# Patient Record
Sex: Female | Born: 1980 | Race: White | Hispanic: No | Smoking: Current every day smoker
Health system: Southern US, Community
[De-identification: ages and names within clinical notes are randomized; demographics above are authoritative.]

## PROBLEM LIST (undated history)

## (undated) DIAGNOSIS — K219 Gastro-esophageal reflux disease without esophagitis: Secondary | ICD-10-CM

## (undated) DIAGNOSIS — M797 Fibromyalgia: Secondary | ICD-10-CM

## (undated) DIAGNOSIS — K759 Inflammatory liver disease, unspecified: Secondary | ICD-10-CM

## (undated) DIAGNOSIS — B1920 Unspecified viral hepatitis C without hepatic coma: Secondary | ICD-10-CM

## (undated) DIAGNOSIS — F32A Depression, unspecified: Secondary | ICD-10-CM

## (undated) DIAGNOSIS — F209 Schizophrenia, unspecified: Secondary | ICD-10-CM

## (undated) DIAGNOSIS — I10 Essential (primary) hypertension: Secondary | ICD-10-CM

## (undated) HISTORY — PX: CHOLECYSTECTOMY: SHX55

## (undated) HISTORY — PX: TUBAL LIGATION: SHX77

## (undated) HISTORY — PX: BREAST SURGERY: SHX581

## (undated) HISTORY — PX: TONSILLECTOMY: SUR1361

## (undated) HISTORY — PX: EUSTACHIAN TUBE DILATION: SHX6770

---
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
## 2003-07-19 ENCOUNTER — Inpatient Hospital Stay (HOSPITAL_COMMUNITY): Admission: AD | Admit: 2003-07-19 | Discharge: 2003-07-23 | Payer: Self-pay | Admitting: Psychiatry

## 2008-12-15 DIAGNOSIS — IMO0001 Reserved for inherently not codable concepts without codable children: Secondary | ICD-10-CM | POA: Insufficient documentation

## 2008-12-15 DIAGNOSIS — F329 Major depressive disorder, single episode, unspecified: Secondary | ICD-10-CM | POA: Insufficient documentation

## 2008-12-15 DIAGNOSIS — IMO0002 Reserved for concepts with insufficient information to code with codable children: Secondary | ICD-10-CM | POA: Insufficient documentation

## 2008-12-15 DIAGNOSIS — T85898A Other specified complication of other internal prosthetic devices, implants and grafts, initial encounter: Secondary | ICD-10-CM | POA: Insufficient documentation

## 2008-12-15 DIAGNOSIS — F32A Depression, unspecified: Secondary | ICD-10-CM | POA: Insufficient documentation

## 2008-12-15 DIAGNOSIS — R21 Rash and other nonspecific skin eruption: Secondary | ICD-10-CM | POA: Insufficient documentation

## 2008-12-29 ENCOUNTER — Encounter: Payer: Self-pay | Admitting: Internal Medicine

## 2009-01-06 ENCOUNTER — Ambulatory Visit: Payer: Self-pay | Admitting: Infectious Diseases

## 2009-01-06 LAB — CONVERTED CEMR LAB
ALT: 15 units/L (ref 0–35)
AST: 17 units/L (ref 0–37)
Albumin: 4.1 g/dL (ref 3.5–5.2)
Alkaline Phosphatase: 83 units/L (ref 39–117)
BUN: 12 mg/dL (ref 6–23)
Basophils Absolute: 0 10*3/uL (ref 0.0–0.1)
Basophils Relative: 0 % (ref 0–1)
CO2: 32 meq/L (ref 19–32)
Calcium: 9.6 mg/dL (ref 8.4–10.5)
Chloride: 98 meq/L (ref 96–112)
Creatinine, Ser: 0.78 mg/dL (ref 0.40–1.20)
Eosinophils Absolute: 0.2 10*3/uL (ref 0.0–0.7)
Eosinophils Relative: 1 % (ref 0–5)
GFR calc Af Amer: >60 mL/min (ref >60)
GFR calc non Af Amer: >60 mL/min (ref >60)
Glucose, Bld: 85 mg/dL (ref 70–99)
HCT: 40.7 % (ref 36.0–46.0)
Hemoglobin: 13.8 g/dL (ref 12.0–15.0)
Lymphocytes Relative: 19 % (ref 12–46)
Lymphs Abs: 2.9 10*3/uL (ref 0.7–4.0)
MCHC: 33.9 g/dL (ref 30.0–36.0)
MCV: 92.1 fL (ref 78.0–100.0)
Monocytes Absolute: 0.9 10*3/uL (ref 0.1–1.0)
Monocytes Relative: 6 % (ref 3–12)
Neutro Abs: 11.1 10*3/uL — ABNORMAL HIGH (ref 1.7–7.7)
Neutrophils Relative %: 74 % (ref 43–77)
Platelets: 262 10*3/uL (ref 150–400)
Potassium: 4.3 meq/L (ref 3.5–5.3)
RBC: 4.42 M/uL (ref 3.87–5.11)
RDW: 13 % (ref 11.5–15.5)
Sed Rate: 13 mm/hr (ref 0–22)
Sodium: 140 meq/L (ref 135–145)
Total Bilirubin: 0.3 mg/dL (ref 0.3–1.2)
Total Protein: 7 g/dL (ref 6.0–8.3)
WBC: 15.1 10*3/uL — ABNORMAL HIGH (ref 4.0–10.5)

## 2009-01-07 ENCOUNTER — Encounter: Payer: Self-pay | Admitting: Infectious Diseases

## 2009-01-08 ENCOUNTER — Encounter: Payer: Self-pay | Admitting: Infectious Diseases

## 2009-02-03 ENCOUNTER — Ambulatory Visit: Payer: Self-pay | Admitting: Infectious Diseases

## 2009-05-11 ENCOUNTER — Encounter: Payer: Self-pay | Admitting: Infectious Diseases

## 2009-11-23 ENCOUNTER — Encounter: Payer: Self-pay | Admitting: Infectious Diseases

## 2010-10-26 NOTE — Consult Note (Signed)
Summary: [facility]  [facility]   Imported By: Florinda Marker 03/11/2009 15:31:26  _____________________________________________________________________  External Attachment:    Type:   Image     Comment:   External Document

## 2010-10-26 NOTE — Assessment & Plan Note (Signed)
Summary: 1 MONTH F/U MRSA/CFB   Visit Type:  Follow-up Primary Provider:  Dr Ardelle Park in [place]  CC:  follow-up visit; place at back of head has drainage.  History of Present Illness: 30 yo with history of bipolar who I saw for recurrent abscesses a month ago.  Treated her with doxy and bactroban.  Cx of wound did grow MRSA.  Presents now for follow up and is tearful and says she is very stressed. She finished the doxycyclein and bactroban nasal ointment. ALso using the soap rec by the dermatologist. Per pt the doxy had no effect on the progression or occurrence of th s kin lesions.   Has not seen derm in follow up.  Is having a lot of issues with nerve pills. Lesions on breast are healing and then recurring.   Stays hot all the time.   Under alot of stress with husband and kids.    Preventive Screening-Counseling & Management     Alcohol drinks/day: 0     Smoking Status: current     Packs/Day: 0.5     Caffeine use/day: 3 20oz. bottles per day     Does Patient Exercise: no     Seat Belt Use: yes   Prior Medication List:  NAPROXEN DR 500 MG TBEC (NAPROXEN) Take 1 tablet by mouth two times a day for 14 days VISTARIL 25 MG CAPS (HYDROXYZINE PAMOATE) Take 1 tablet by mouth every 6 hours as needed itching DOXYCYCLINE HYCLATE 100 MG CPEP (DOXYCYCLINE HYCLATE) one by mouth two times a day BACTROBAN NASAL 2 % OINT (MUPIROCIN CALCIUM) apply to nares twice a day for 2 weeks   Current Allergies (reviewed today): No known allergies  Past History:  Past Medical History:    Fibromyalgia    Bipolar    Oral HSV    Breast implants     (01/06/2009)  Past Surgical History:    BTL    Gallbladder resection    Breast implants - leaking implant (01/06/2009)  Family History:    Father has skin boils  (01/06/2009)  Social History:    Has 2 dogs at home.  No other skin lesions at home.      Has a 84 yo daughter and a 53 yo.      No use of hot tubs or whirlpool foot baths  (01/06/2009)  Risk Factors:    Alcohol Use: 0 (02/03/2009)    >5 drinks/d w/in last 3 months: N/A    Caffeine Use: 3 20oz. bottles per day (02/03/2009)    Diet: N/A    Exercise: no (02/03/2009)  Risk Factors:    Smoking Status: current (02/03/2009)    Packs/Day: 0.5 (02/03/2009)    Cigars/wk: N/A    Pipe Use/wk: N/A    Cans of tobacco/wk: N/A    Passive Smoke Exposure: N/A  Review of Systems       11 systems reviewed and negative except per HPI   Vital Signs:  Patient profile:   30 year old female Height:      64 inches (162.56 cm) Weight:      189.7 pounds (86.23 kg) BMI:     32.68 Temp:     97.6 degrees F (36.44 degrees C) oral Pulse rate:   100 / minute BP sitting:   150 / 98  (right arm)  Vitals Entered By: Baxter Hire) (Feb 03, 2009 2:09 PM) CC: follow-up visit; place at back of head has drainage Is Patient Diabetic? No Pain Assessment Patient  in pain? yes     Location: body aches Intensity: 7 Type: aching Onset of pain  Constant pain  Nutritional Status BMI of > 30 = obese Nutritional Status Detail no appetite per patient  Have you ever been in a relationship where you felt threatened, hurt or afraid?Unable to ask   Does patient need assistance? Functional Status Self care Ambulation Normal   Physical Exam  General:  alert.   Head:  scalp with several areas of denuded skin but no active drainage Eyes:  vision grossly intact and pupils equal.   Mouth:  fair dentition.   Neck:  supple.   Breasts:  bil scaring from prior lesions Lungs:  normal respiratory effort and no intercostal retractions.   Heart:  normal rate and regular rhythm.   Abdomen:  soft and non-tender.   Msk:  normal ROM and no joint tenderness.   Extremities:  no cce Neurologic:  alert & oriented X3.   Skin:  L ant shin with 2 areas of denuded ulcers but no drainage arms with a few areas of old healed scabs Cervical Nodes:  no anterior cervical adenopathy and no posterior  cervical adenopathy.   Psych:  quite anxious and depressed appeariong, tearful   Impression & Recommendations:  Problem # 1:  CELLULITIS AND ABSCESS OF UPPER ARM AND FOREARM (ICD-682.3) She is colonized with MRSA which would explain the recurrences of the skin lesions when she gets breaks in the skin.  I think she may also be scratching and picking at the lesions especially given her anxiety and depression.   Will treat her with doxy for a prolonged period at this point - rx givne.  Also will try clindamycin lotion for armpits and groin to decrease colonization.   Suppressive MRSA treatment -  Recommended to use bactroban nasal ointment to anterior  nares two times a day and hibiclens 4% shower lotion daily for next month.   Advised re  sharing towels, hygiene and need to present for I and D and antibiotics if lesions recur. I have called her PCP office to get records faxed re prior biopsy results and micro results and will await those as well. She may very well need repeat derm eval  here in [place] to repeat bxp and send for routine cx, afb and fungal cx and path.  I will see back in 2 months or sooner if needed.   The following medications were removed from the medication list:    Doxycycline Hyclate 100 Mg Cpep (Doxycycline hyclate) ..... One by mouth two times a day Her updated medication list for this problem includes:    Doxycycline Hyclate 100 Mg Caps (Doxycycline hyclate) ..... One by mouth two times a day  Problem # 2:  DEPRESSION (ICD-311) She needs to follow up with her PCP for her stress and anxiety. Her updated medication list for this problem includes:    Vistaril 25 Mg Caps (Hydroxyzine pamoate) .Marland Kitchen... Take 1 tablet by mouth every 6 hours as needed itching  Medications Added to Medication List This Visit: 1)  Doxycycline Hyclate 100 Mg Caps (Doxycycline hyclate) .... One by mouth two times a day 2)  Cleocin-t 1 % Lotn (Clindamycin phosphate) .... Apply to armpits and groin  twice a day 3)  Bactroban Nasal 2 % Oint (Mupirocin calcium) .... Apply to nose twice a day indefinitely 4)  Hibiclens 4 % Liqd (Chlorhexidine gluconate) .... Shower daily with the soap as directed 5)  Fluconazole 150 Mg Tabs (Fluconazole) .Marland KitchenMarland KitchenMarland Kitchen  Take as needed for yeast infection  Patient Instructions: 1)  Please schedule a follow-up appointment in 2 months. Prescriptions: FLUCONAZOLE 150 MG TABS (FLUCONAZOLE) take as needed for yeast infection  #4 x 1   Entered and Authorized by:   Clydie Braun MD   Signed by:   Clydie Braun MD on 02/03/2009   Method used:   Print then Give to Patient   RxID:   [id number] HIBICLENS 4 % LIQD (CHLORHEXIDINE GLUCONATE) shower daily with the soap as directed  #1 x 1   Entered and Authorized by:   Clydie Braun MD   Signed by:   Clydie Braun MD on 02/03/2009   Method used:   Print then Give to Patient   RxID:   [id number] NATFTDDUK NASAL 2 % OINT (MUPIROCIN CALCIUM) apply to nose twice a day indefinitely  #1 x 3   Entered and Authorized by:   Clydie Braun MD   Signed by:   Clydie Braun MD on 02/03/2009   Method used:   Print then Give to Patient   RxID:   [id number] CLEOCIN-T 1 % LOTN (CLINDAMYCIN PHOSPHATE) apply to armpits and groin twice a day  #1 x 1   Entered and Authorized by:   Clydie Braun MD   Signed by:   Clydie Braun MD on 02/03/2009   Method used:   Print then Give to Patient   RxID:   [id number] DOXYCYCLINE HYCLATE 100 MG CAPS (DOXYCYCLINE HYCLATE) one by mouth two times a day  #60 x 1   Entered and Authorized by:   Clydie Braun MD   Signed by:   Clydie Braun MD on 02/03/2009   Method used:   Print then Give to Patient   RxID:   [id number]

## 2010-10-26 NOTE — Miscellaneous (Signed)
Summary: [facility]  [facility]   Imported By: Florinda Marker 05/13/2009 15:04:58  _____________________________________________________________________  External Attachment:    Type:   Image     Comment:   External Document

## 2010-10-26 NOTE — Miscellaneous (Signed)
Summary: HIPAA Restrictions  HIPAA Restrictions   Imported By: Florinda Marker 01/06/2009 15:04:02  _____________________________________________________________________  External Attachment:    Type:   Image     Comment:   External Document

## 2010-10-26 NOTE — Assessment & Plan Note (Signed)
Summary: new pt body lesion resfrom 4/5/kam   Visit Type:  Consult Primary Provider:  Dr Ardelle Park in [place]  CC:  New Patient;body lesion .  History of Present Illness: 30 yo with bipolar disorder,  Her skin lesions began when she had breast implants Dec 07.  She developed 1 yr ago some  lesions on the right.  Begin as a pimple and then scar over.  In the beginning they drained pus and were very itchy but now not draining. Some spreading redness comes and goes. Has had extensive work up for these with visits to surgeons, dermatologists and gotten a number of courses of antibiotics.  Also getting steroid injsctions into nodule in breast. No other sick contacts or skin lesions in family members except daughter with molluscum.  No diffuse pruritis but does have some ithcing over breasts.  Also developing new lesions under arms and on scalp (most recent in March).  Currently also has one on L leg.   Does have fevers chills with these at times.  Also gets joint pains that make it difficult to o walk.    Bxp done at [facility] - Dr Lars Pinks.      Preventive Screening-Counseling & Management     Alcohol drinks/day: occassionally     Alcohol type: beer     Smoking Status: current     Packs/Day: 0.5     Caffeine use/day: yes, sodas all day     Does Patient Exercise: no     Seat Belt Use: yes   Updated Prior Medication List: NAPROXEN DR 500 MG TBEC (NAPROXEN) Take 1 tablet by mouth two times a day for 14 days VISTARIL 25 MG CAPS (HYDROXYZINE PAMOATE) Take 1 tablet by mouth every 6 hours as needed itching  Current Allergies (reviewed today): No known allergies  Past History:  Family History:    Father has skin boils  (01/06/2009)  Social History:    Has 2 dogs at home.  No other skin lesions at home.      Has a 13 yo daughter and a 58 yo.      No use of hot tubs or whirlpool foot baths (01/06/2009)  Risk Factors:    Alcohol Use: occassionally (01/06/2009)    >5 drinks/d w/in last 3  months: N/A    Caffeine Use: yes, sodas all day (01/06/2009)    Diet: N/A    Exercise: no (01/06/2009)  Risk Factors:    Smoking Status: current (01/06/2009)    Packs/Day: 0.5 (01/06/2009)    Cigars/wk: N/A    Pipe Use/wk: N/A    Cans of tobacco/wk: N/A    Passive Smoke Exposure: N/A  Past Medical History:    Fibromyalgia    Bipolar    Oral HSV    Breast implants  Past Surgical History:    BTL    Gallbladder resection    Breast implants - leaking implant  Family History:    Father has skin boils   Social History:    Has 2 dogs at home.  No other skin lesions at home.      Has a 15 yo daughter and a 48 yo.      No use of hot tubs or whirlpool foot baths  Review of Systems       11 systems reviewed and negative except per HPI   Vital Signs:  Patient profile:   30 year old female Height:      64 inches (162.56 cm) Weight:  192.8 pounds (87.64 kg) BMI:     33.21 Temp:     97.5 degrees F (36.39 degrees C) oral Pulse rate:   116 / minute BP sitting:   180 / 111  (left arm)  Vitals Entered By: Baxter Hire) (January 06, 2009 11:53 AM) CC: New Patient;body lesion  Is Patient Diabetic? No Pain Assessment Patient in pain? yes     Location: legs and joints Intensity: 4 Type: cramping in legs;aching in joints Nutritional Status BMI of > 30 = obese Nutritional Status Detail not good   Have you ever been in a relationship where you felt threatened, hurt or afraid?Unable to ask   Does patient need assistance? Functional Status Self care Ambulation Normal   Physical Exam  General:  alert and well-developed.  overwt Eyes:  vision grossly intact and pupils equal.   Mouth:  good dentition.   Neck:  supple.   Lungs:  normal respiratory effort and normal breath sounds.   Heart:  normal rate, regular rhythm, and no murmur.   Abdomen:  soft and non-tender.   Msk:  normal ROM, no joint tenderness, and no joint swelling.   Extremities:  no cce Neurologic:   alert & oriented X3, cranial nerves II-XII intact, and strength normal in all extremities.   Skin:  L breast with 2 1/2 cm  nodules that are firm, rubbery and non tender also has a keloid apperaing scar on teh breast  L ant shin with 2 cm lesions that are shallow and red based in nature. area of healed alopecia about 3 cm in diam on post scalp but no active lesion there Cervical Nodes:  no anterior cervical adenopathy and no posterior cervical adenopathy.   Axillary Nodes:  bil axila with small pustules appearing like hidradenitis Psych:  Oriented X3 and memory intact for recent and remote.     Impression & Recommendations:  Problem # 1:  RASH AND OTHER NONSPECIFIC SKIN ERUPTION (ICD-782.1) Several months of skin lesions in a relatively healthy young woman,   I have a paucity of data and records and it appears she has had an extensive work up for this including biopsies and cultures My diff dx would include  recurrent MRSA, pyoderma, atypical mycobacterial infeciton, connective tissue disease, vascilitis, atypical supprative hidradenitis, keloid. Today will check  basic BW including ESR and I have swabbed her leg lesion for routine cx. If MRSA is present  I think she will likely need prolonged suppressive abx, chlorohexidine and bactroban to ensure that she is not jsut having recurrent MRSA boils with scarring.    After obtaining records will need to consider repeat bxp for path, fungal and afb cx.   Orders: Consultation Level IV (09811) T-Comprehensive Metabolic Panel [phone]) T-CBC w/Diff [id number]) T- Sed rate non-auto (96295) T-Syphilis Test ([facility]) [id number]) T-Culture, Wound ([id number])  Medications Added to Medication List This Visit: 1)  Doxycycline Hyclate 100 Mg Cpep (Doxycycline hyclate) .... One by mouth two times a day 2)  Bactroban Nasal 2 % Oint (Mupirocin calcium) .... Apply to nares twice a day for 2 weeks  Patient Instructions: 1)  Please schedule  a follow-up appointment in 3 weeks.   Prescriptions: BACTROBAN NASAL 2 % OINT (MUPIROCIN CALCIUM) apply to nares twice a day for 2 weeks  #1 x 1   Entered and Authorized by:   Clydie Braun MD   Signed by:   Clydie Braun MD on 01/09/2009   Method used:   Electronically to  [facility]  [address] [phone]* (retail)       [address]       [place], [place]  [address]       Ph: [phone] or [phone]       Fax: [phone]   RxID:   [id number] DOXYCYCLINE HYCLATE 100 MG CPEP (DOXYCYCLINE HYCLATE) one by mouth two times a day  #20 x 0   Entered and Authorized by:   Clydie Braun MD   Signed by:   Clydie Braun MD on 01/09/2009   Method used:   [facility]ically to        [facility]  [address] [phone]* (retail)       [address]       [place], [place]  [address]       Ph: [phone] or [phone]       Fax: [phone]   RxID:   [id number]

## 2010-10-26 NOTE — Consult Note (Signed)
Summary: New Pt. Referral: [facility]  New Pt. Referral: [facility]   Imported By: Florinda Marker 02/02/2009 12:19:22  _____________________________________________________________________  External Attachment:    Type:   Image     Comment:   External Document

## 2010-10-29 NOTE — Miscellaneous (Signed)
Summary: [facility]  [facility]   Imported By: Florinda Marker 11/26/2009 10:24:12  _____________________________________________________________________  External Attachment:    Type:   Image     Comment:   External Document

## 2017-11-10 DIAGNOSIS — F1210 Cannabis abuse, uncomplicated: Secondary | ICD-10-CM | POA: Insufficient documentation

## 2017-11-10 DIAGNOSIS — J101 Influenza due to other identified influenza virus with other respiratory manifestations: Secondary | ICD-10-CM | POA: Insufficient documentation

## 2020-12-31 ENCOUNTER — Inpatient Hospital Stay (HOSPITAL_COMMUNITY): Payer: Medicare Other | Admitting: Certified Registered"

## 2020-12-31 ENCOUNTER — Encounter (HOSPITAL_COMMUNITY)
Admission: RE | Disposition: A | Discharge status: Home / Self Care | Payer: Self-pay | Source: Home / Self Care | Attending: Orthopedic Surgery

## 2020-12-31 ENCOUNTER — Inpatient Hospital Stay (HOSPITAL_COMMUNITY): Payer: Medicare Other

## 2020-12-31 ENCOUNTER — Other Ambulatory Visit: Payer: Self-pay

## 2020-12-31 ENCOUNTER — Inpatient Hospital Stay (HOSPITAL_COMMUNITY)
Admission: RE | Admit: 2020-12-31 | Discharge: 2021-01-02 | DRG: 481 | Disposition: A | Discharge status: Home / Self Care | Payer: Medicare Other | Attending: Orthopedic Surgery | Admitting: Orthopedic Surgery

## 2020-12-31 ENCOUNTER — Encounter (HOSPITAL_COMMUNITY): Payer: Self-pay | Admitting: Orthopedic Surgery

## 2020-12-31 DIAGNOSIS — F17210 Nicotine dependence, cigarettes, uncomplicated: Secondary | ICD-10-CM | POA: Diagnosis not present

## 2020-12-31 DIAGNOSIS — E8889 Other specified metabolic disorders: Secondary | ICD-10-CM | POA: Diagnosis not present

## 2020-12-31 DIAGNOSIS — F1911 Other psychoactive substance abuse, in remission: Secondary | ICD-10-CM

## 2020-12-31 DIAGNOSIS — E559 Vitamin D deficiency, unspecified: Secondary | ICD-10-CM | POA: Diagnosis present

## 2020-12-31 DIAGNOSIS — K219 Gastro-esophageal reflux disease without esophagitis: Secondary | ICD-10-CM | POA: Diagnosis not present

## 2020-12-31 DIAGNOSIS — I10 Essential (primary) hypertension: Secondary | ICD-10-CM | POA: Diagnosis not present

## 2020-12-31 DIAGNOSIS — Z79899 Other long term (current) drug therapy: Secondary | ICD-10-CM | POA: Diagnosis not present

## 2020-12-31 DIAGNOSIS — Z419 Encounter for procedure for purposes other than remedying health state, unspecified: Secondary | ICD-10-CM

## 2020-12-31 DIAGNOSIS — S93401A Sprain of unspecified ligament of right ankle, initial encounter: Secondary | ICD-10-CM | POA: Diagnosis not present

## 2020-12-31 DIAGNOSIS — S93432A Sprain of tibiofibular ligament of left ankle, initial encounter: Secondary | ICD-10-CM | POA: Diagnosis present

## 2020-12-31 DIAGNOSIS — Z9049 Acquired absence of other specified parts of digestive tract: Secondary | ICD-10-CM

## 2020-12-31 DIAGNOSIS — S82402A Unspecified fracture of shaft of left fibula, initial encounter for closed fracture: Secondary | ICD-10-CM | POA: Diagnosis present

## 2020-12-31 DIAGNOSIS — Z885 Allergy status to narcotic agent status: Secondary | ICD-10-CM

## 2020-12-31 DIAGNOSIS — D62 Acute posthemorrhagic anemia: Secondary | ICD-10-CM | POA: Diagnosis not present

## 2020-12-31 DIAGNOSIS — Z880 Allergy status to penicillin: Secondary | ICD-10-CM | POA: Diagnosis not present

## 2020-12-31 DIAGNOSIS — M797 Fibromyalgia: Secondary | ICD-10-CM | POA: Diagnosis present

## 2020-12-31 DIAGNOSIS — Z20822 Contact with and (suspected) exposure to covid-19: Secondary | ICD-10-CM | POA: Diagnosis not present

## 2020-12-31 DIAGNOSIS — F209 Schizophrenia, unspecified: Secondary | ICD-10-CM | POA: Diagnosis present

## 2020-12-31 DIAGNOSIS — S82872A Displaced pilon fracture of left tibia, initial encounter for closed fracture: Principal | ICD-10-CM | POA: Diagnosis present

## 2020-12-31 DIAGNOSIS — F17200 Nicotine dependence, unspecified, uncomplicated: Secondary | ICD-10-CM | POA: Diagnosis present

## 2020-12-31 DIAGNOSIS — Z888 Allergy status to other drugs, medicaments and biological substances status: Secondary | ICD-10-CM

## 2020-12-31 DIAGNOSIS — B1920 Unspecified viral hepatitis C without hepatic coma: Secondary | ICD-10-CM | POA: Diagnosis present

## 2020-12-31 DIAGNOSIS — T148XXA Other injury of unspecified body region, initial encounter: Secondary | ICD-10-CM

## 2020-12-31 DIAGNOSIS — W19XXXA Unspecified fall, initial encounter: Secondary | ICD-10-CM | POA: Diagnosis not present

## 2020-12-31 DIAGNOSIS — S8290XA Unspecified fracture of unspecified lower leg, initial encounter for closed fracture: Secondary | ICD-10-CM | POA: Insufficient documentation

## 2020-12-31 HISTORY — DX: Unspecified viral hepatitis C without hepatic coma: B19.20

## 2020-12-31 HISTORY — DX: Fibromyalgia: M79.7

## 2020-12-31 HISTORY — DX: Essential (primary) hypertension: I10

## 2020-12-31 HISTORY — PX: EXTERNAL FIXATION LEG: SHX1549

## 2020-12-31 HISTORY — DX: Gastro-esophageal reflux disease without esophagitis: K21.9

## 2020-12-31 HISTORY — DX: Schizophrenia, unspecified: F20.9

## 2020-12-31 LAB — COMPREHENSIVE METABOLIC PANEL
ALT: 24 U/L (ref 0–44)
AST: 27 U/L (ref 15–41)
Albumin: 4 g/dL (ref 3.5–5.0)
Alkaline Phosphatase: 60 U/L (ref 38–126)
Anion gap: 6 (ref 5–15)
BUN: 11 mg/dL (ref 6–20)
CO2: 27 mmol/L (ref 22–32)
Calcium: 9.2 mg/dL (ref 8.9–10.3)
Chloride: 103 mmol/L (ref 98–111)
Creatinine, Ser: 0.73 mg/dL (ref 0.44–1.00)
GFR, Estimated: >60 mL/min (ref >60)
Glucose, Bld: 105 mg/dL — ABNORMAL HIGH (ref 70–99)
Potassium: 3.9 mmol/L (ref 3.5–5.1)
Sodium: 136 mmol/L (ref 135–145)
Total Bilirubin: 1.1 mg/dL (ref 0.3–1.2)
Total Protein: 6.8 g/dL (ref 6.5–8.1)

## 2020-12-31 LAB — RAPID URINE DRUG SCREEN, HOSP PERFORMED
Amphetamines: POSITIVE — AB
Barbiturates: NOT DETECTED
Benzodiazepines: POSITIVE — AB
Cocaine: NOT DETECTED
Opiates: POSITIVE — AB
Tetrahydrocannabinol: POSITIVE — AB

## 2020-12-31 LAB — CBC WITH DIFFERENTIAL/PLATELET
Abs Immature Granulocytes: 0.05 10*3/uL (ref 0.00–0.07)
Basophils Absolute: 0.1 10*3/uL (ref 0.0–0.1)
Basophils Relative: 1 %
Eosinophils Absolute: 0.1 10*3/uL (ref 0.0–0.5)
Eosinophils Relative: 2 %
HCT: 39.5 % (ref 36.0–46.0)
Hemoglobin: 13.3 g/dL (ref 12.0–15.0)
Immature Granulocytes: 1 %
Lymphocytes Relative: 21 %
Lymphs Abs: 1.8 10*3/uL (ref 0.7–4.0)
MCH: 31.6 pg (ref 26.0–34.0)
MCHC: 33.7 g/dL (ref 30.0–36.0)
MCV: 93.8 fL (ref 80.0–100.0)
Monocytes Absolute: 0.8 10*3/uL (ref 0.1–1.0)
Monocytes Relative: 9 %
Neutro Abs: 5.9 10*3/uL (ref 1.7–7.7)
Neutrophils Relative %: 66 %
Platelets: 229 10*3/uL (ref 150–400)
RBC: 4.21 MIL/uL (ref 3.87–5.11)
RDW: 12.7 % (ref 11.5–15.5)
WBC: 8.7 10*3/uL (ref 4.0–10.5)
nRBC: 0 % (ref 0.0–0.2)

## 2020-12-31 LAB — PROTIME-INR
INR: 1 (ref 0.8–1.2)
Prothrombin Time: 13 seconds (ref 11.4–15.2)

## 2020-12-31 LAB — URINALYSIS, ROUTINE W REFLEX MICROSCOPIC
Bilirubin Urine: NEGATIVE
Glucose, UA: NEGATIVE mg/dL
Ketones, ur: 5 mg/dL — AB
Nitrite: NEGATIVE
Protein, ur: 100 mg/dL — AB
Specific Gravity, Urine: 1.028 (ref 1.005–1.030)
WBC, UA: >50 WBC/hpf — ABNORMAL HIGH (ref 0–5)
pH: 5 (ref 5.0–8.0)

## 2020-12-31 LAB — SURGICAL PCR SCREEN
MRSA, PCR: NEGATIVE
Staphylococcus aureus: NEGATIVE

## 2020-12-31 LAB — POCT PREGNANCY, URINE: Preg Test, Ur: NEGATIVE

## 2020-12-31 LAB — CREATININE, SERUM: Creatinine, Ser: 0.69 mg/dL (ref 0.44–1.00)

## 2020-12-31 LAB — CBC
Hemoglobin: 12.5 g/dL (ref 12.0–15.0)
MCV: 91 fL (ref 80.0–100.0)
WBC: 12.3 10*3/uL — ABNORMAL HIGH (ref 4.0–10.5)
nRBC: 0 % (ref 0.0–0.2)

## 2020-12-31 LAB — SARS CORONAVIRUS 2 BY RT PCR (HOSPITAL ORDER, PERFORMED IN ~~LOC~~ HOSPITAL LAB): SARS Coronavirus 2: NEGATIVE

## 2020-12-31 LAB — VITAMIN D 25 HYDROXY (VIT D DEFICIENCY, FRACTURES): Vit D, 25-Hydroxy: 16.31 ng/mL — ABNORMAL LOW (ref 30–100)

## 2020-12-31 IMAGING — RF DG C-ARM 1-60 MIN
1 series · 3 of 3 positions shown · non-contrast
Comparison: Radiograph [DATE]

CLINICAL DATA: External fixation left ankle.

EXAM:
LEFT ANKLE COMPLETE - 3+ VIEW; DG C-ARM 1-60 MIN

[Series 1: run · 3 of 3 slices shown]
[im 1/3]
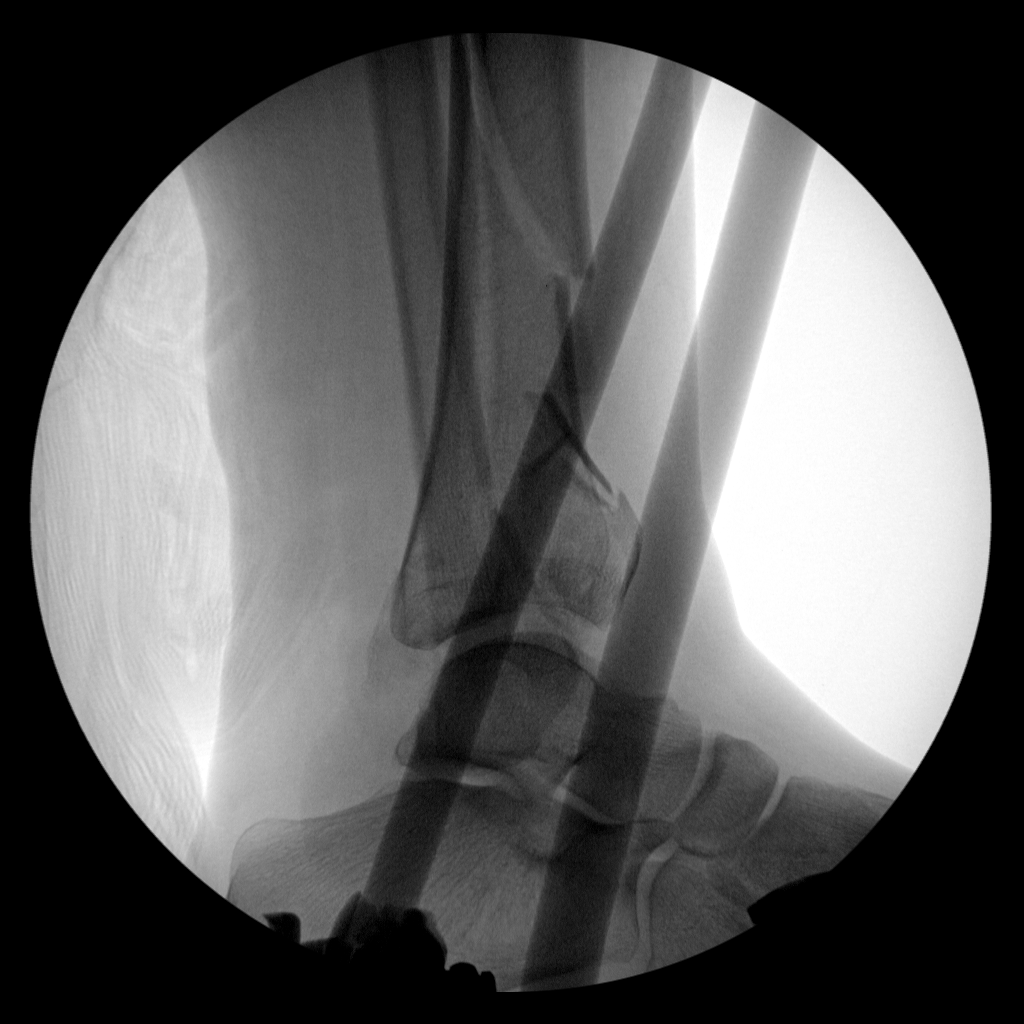
[im 2/3]
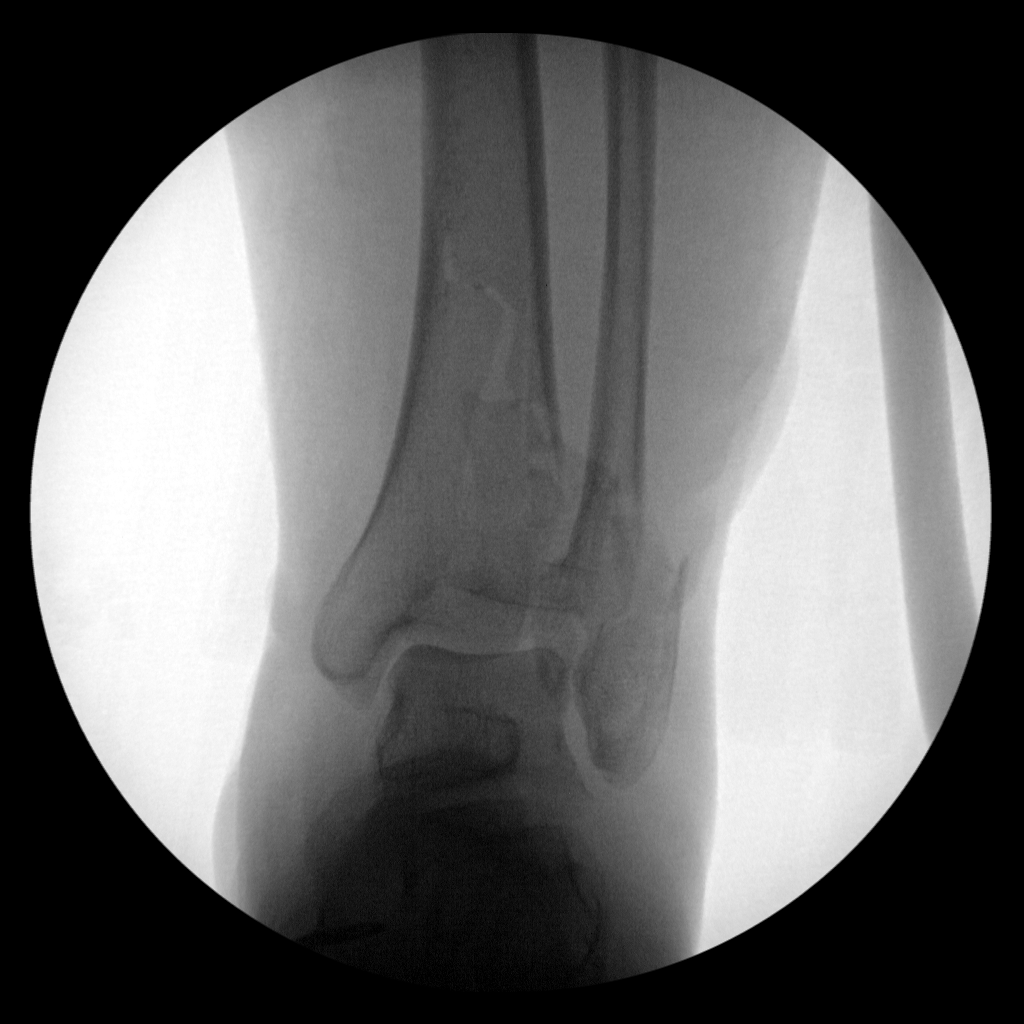
[im 3/3]
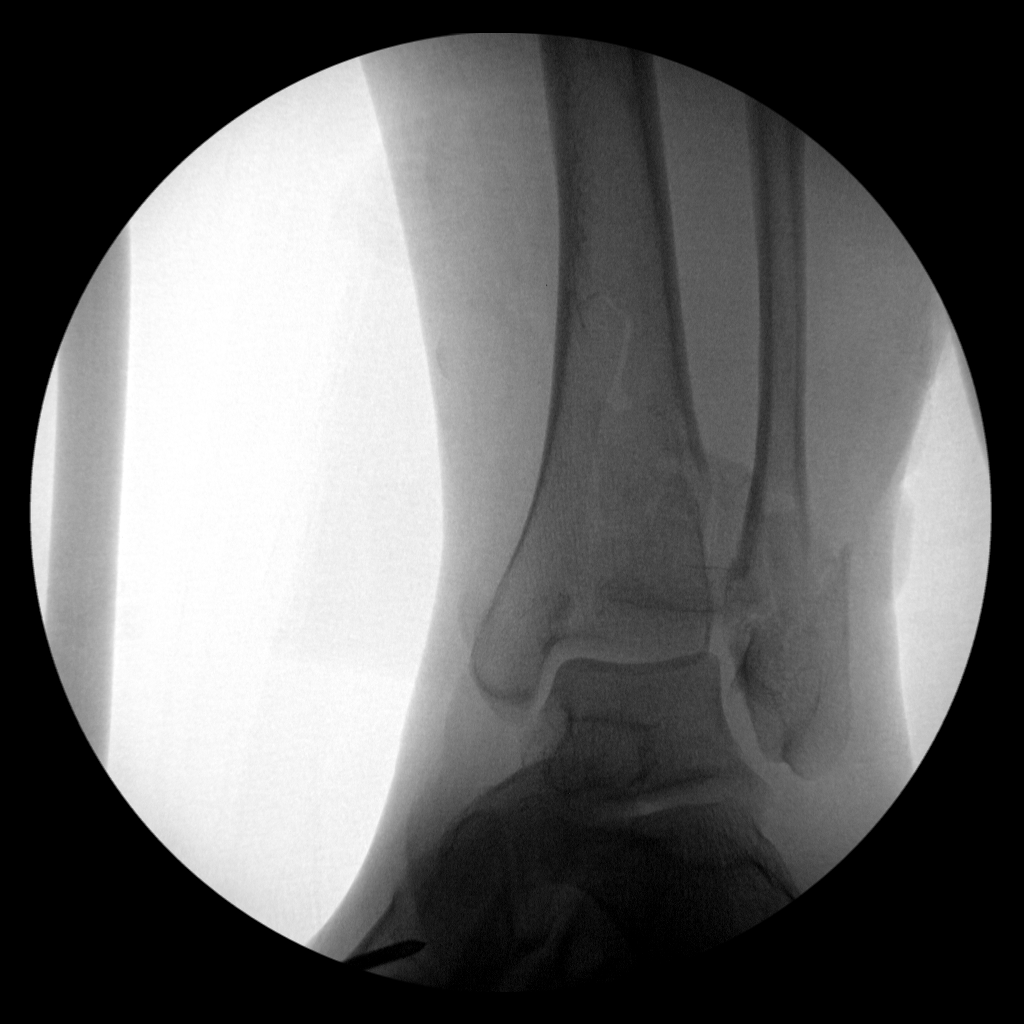

[3 of 3 positions shown; findings below may reference images not displayed]

FINDINGS: Three fluoroscopic spot views of the left ankle obtained in the
operating room in frontal, lateral, and oblique projections. Distal
tibia and fibular fractures are again seen. External fixator is seen
on the lateral view with pain tentatively seen in the calcaneus,
though not entirely included in the field of view. Total fluoroscopy
time 32 seconds. Total dose 1.17 mGy.
IMPRESSION: Intraoperative fluoroscopy during left ankle fracture fixation.

## 2020-12-31 SURGERY — EXTERNAL FIXATION, LOWER EXTREMITY
Anesthesia: General | Site: Ankle | Laterality: Left

## 2020-12-31 MED ORDER — ENOXAPARIN SODIUM 40 MG/0.4ML ~~LOC~~ SOLN
40.0000 mg | SUBCUTANEOUS | Status: DC
  Administered 2021-01-01: 40 mg via SUBCUTANEOUS
  Filled 2020-12-31: qty 0.4

## 2020-12-31 MED ORDER — ASCORBIC ACID 500 MG PO TABS
1000.0000 mg | ORAL_TABLET | Freq: Every day | ORAL | Status: DC
  Administered 2020-12-31 – 2021-01-02 (×3): 1000 mg via ORAL
  Filled 2020-12-31 (×3): qty 2

## 2020-12-31 MED ORDER — HYDROMORPHONE HCL 1 MG/ML IJ SOLN
0.5000 mg | INTRAMUSCULAR | Status: DC | PRN
  Administered 2020-12-31: 0.5 mg via INTRAVENOUS

## 2020-12-31 MED ORDER — CHLORHEXIDINE GLUCONATE 4 % EX LIQD
60.0000 mL | Freq: Once | CUTANEOUS | Status: DC
Start: 2020-12-31 — End: 2020-12-31

## 2020-12-31 MED ORDER — ORAL CARE MOUTH RINSE
15.0000 mL | Freq: Once | OROMUCOSAL | Status: AC
Start: 2020-12-31 — End: 2020-12-31

## 2020-12-31 MED ORDER — MIDAZOLAM HCL 2 MG/2ML IJ SOLN
INTRAMUSCULAR | Status: DC | PRN
  Administered 2020-12-31: 2 mg via INTRAVENOUS

## 2020-12-31 MED ORDER — METOCLOPRAMIDE HCL 5 MG/ML IJ SOLN: 5.0000 mg | Freq: Three times a day (TID) | INTRAMUSCULAR | Status: DC | PRN

## 2020-12-31 MED ORDER — FENTANYL CITRATE (PF) 250 MCG/5ML IJ SOLN
INTRAMUSCULAR | Status: AC
  Filled 2020-12-31: qty 5

## 2020-12-31 MED ORDER — MIDAZOLAM HCL 5 MG/5ML IJ SOLN
1.0000 mg | Freq: Once | INTRAMUSCULAR | Status: AC
  Administered 2020-12-31: 1 mg via INTRAVENOUS

## 2020-12-31 MED ORDER — KETAMINE HCL 50 MG/5ML IJ SOSY
PREFILLED_SYRINGE | INTRAMUSCULAR | Status: AC
  Filled 2020-12-31: qty 5

## 2020-12-31 MED ORDER — HYDROMORPHONE HCL 1 MG/ML IJ SOLN
0.5000 mg | INTRAMUSCULAR | Status: DC | PRN
  Administered 2021-01-01 (×2): 1 mg via INTRAVENOUS
  Filled 2020-12-31 (×2): qty 1

## 2020-12-31 MED ORDER — CHLORHEXIDINE GLUCONATE 0.12 % MT SOLN
15.0000 mL | Freq: Once | OROMUCOSAL | Status: AC
  Administered 2020-12-31: 15 mL via OROMUCOSAL
  Filled 2020-12-31: qty 15

## 2020-12-31 MED ORDER — ONDANSETRON HCL 4 MG/2ML IJ SOLN: 4.0000 mg | Freq: Once | INTRAMUSCULAR | Status: DC | PRN

## 2020-12-31 MED ORDER — PANTOPRAZOLE SODIUM 40 MG PO TBEC
40.0000 mg | DELAYED_RELEASE_TABLET | Freq: Every day | ORAL | Status: DC
  Administered 2020-12-31 – 2021-01-02 (×3): 40 mg via ORAL
  Filled 2020-12-31 (×3): qty 1

## 2020-12-31 MED ORDER — MIDAZOLAM HCL 2 MG/2ML IJ SOLN
INTRAMUSCULAR | Status: AC
  Filled 2020-12-31: qty 2

## 2020-12-31 MED ORDER — KETAMINE HCL 10 MG/ML IJ SOLN
INTRAMUSCULAR | Status: DC | PRN
  Administered 2020-12-31: 20 mg via INTRAVENOUS
  Administered 2020-12-31: 10 mg via INTRAVENOUS
  Administered 2020-12-31: 20 mg via INTRAVENOUS

## 2020-12-31 MED ORDER — ACETAMINOPHEN 325 MG PO TABS
650.0000 mg | ORAL_TABLET | Freq: Two times a day (BID) | ORAL | Status: DC
  Administered 2020-12-31 – 2021-01-02 (×4): 650 mg via ORAL
  Filled 2020-12-31 (×4): qty 2

## 2020-12-31 MED ORDER — METOCLOPRAMIDE HCL 5 MG PO TABS: 5.0000 mg | ORAL_TABLET | Freq: Three times a day (TID) | ORAL | Status: DC | PRN

## 2020-12-31 MED ORDER — CEFAZOLIN SODIUM-DEXTROSE 2-4 GM/100ML-% IV SOLN
2.0000 g | INTRAVENOUS | Status: AC
  Administered 2020-12-31: 2 g via INTRAVENOUS
  Filled 2020-12-31: qty 100

## 2020-12-31 MED ORDER — FENTANYL CITRATE (PF) 100 MCG/2ML IJ SOLN
50.0000 ug | Freq: Once | INTRAMUSCULAR | Status: AC
  Administered 2020-12-31: 50 ug via INTRAVENOUS

## 2020-12-31 MED ORDER — ROCURONIUM BROMIDE 10 MG/ML (PF) SYRINGE
PREFILLED_SYRINGE | INTRAVENOUS | Status: DC | PRN
  Administered 2020-12-31: 70 mg via INTRAVENOUS

## 2020-12-31 MED ORDER — POTASSIUM CHLORIDE IN NACL 20-0.9 MEQ/L-% IV SOLN
INTRAVENOUS | Status: DC
  Filled 2020-12-31 (×2): qty 1000

## 2020-12-31 MED ORDER — DEXAMETHASONE SODIUM PHOSPHATE 10 MG/ML IJ SOLN
INTRAMUSCULAR | Status: DC | PRN
  Administered 2020-12-31: 10 mg via INTRAVENOUS

## 2020-12-31 MED ORDER — ACETAMINOPHEN 10 MG/ML IV SOLN
INTRAVENOUS | Status: AC
  Filled 2020-12-31: qty 100

## 2020-12-31 MED ORDER — AMISULPRIDE (ANTIEMETIC) 5 MG/2ML IV SOLN: 10.0000 mg | Freq: Once | INTRAVENOUS | Status: DC | PRN

## 2020-12-31 MED ORDER — AMPHETAMINE-DEXTROAMPHETAMINE 5 MG PO TABS
5.0000 mg | ORAL_TABLET | Freq: Every day | ORAL | Status: DC
  Administered 2021-01-01 – 2021-01-02 (×2): 5 mg via ORAL
  Filled 2020-12-31 (×3): qty 1

## 2020-12-31 MED ORDER — OXYCODONE HCL 5 MG PO TABS: 5.0000 mg | ORAL_TABLET | Freq: Once | ORAL | Status: AC | PRN

## 2020-12-31 MED ORDER — ACETAMINOPHEN 500 MG PO TABS
ORAL_TABLET | ORAL | Status: AC
  Administered 2020-12-31: 1000 mg
  Filled 2020-12-31: qty 2

## 2020-12-31 MED ORDER — DOCUSATE SODIUM 100 MG PO CAPS
100.0000 mg | ORAL_CAPSULE | Freq: Two times a day (BID) | ORAL | Status: DC
  Administered 2020-12-31 – 2021-01-02 (×4): 100 mg via ORAL
  Filled 2020-12-31 (×4): qty 1

## 2020-12-31 MED ORDER — HYDROMORPHONE HCL 1 MG/ML IJ SOLN
INTRAMUSCULAR | Status: AC
  Filled 2020-12-31: qty 1

## 2020-12-31 MED ORDER — ONDANSETRON HCL 4 MG PO TABS: 4.0000 mg | ORAL_TABLET | Freq: Four times a day (QID) | ORAL | Status: DC | PRN

## 2020-12-31 MED ORDER — 0.9 % SODIUM CHLORIDE (POUR BTL) OPTIME
TOPICAL | Status: DC | PRN
  Administered 2020-12-31: 1000 mL

## 2020-12-31 MED ORDER — METHOCARBAMOL 750 MG PO TABS
750.0000 mg | ORAL_TABLET | Freq: Three times a day (TID) | ORAL | Status: DC
  Administered 2020-12-31 – 2021-01-02 (×5): 750 mg via ORAL
  Filled 2020-12-31 (×7): qty 1

## 2020-12-31 MED ORDER — MAGNESIUM CITRATE PO SOLN: 1.0000 | Freq: Once | ORAL | Status: DC | PRN

## 2020-12-31 MED ORDER — GABAPENTIN 300 MG PO CAPS
300.0000 mg | ORAL_CAPSULE | Freq: Two times a day (BID) | ORAL | Status: DC
  Administered 2020-12-31 – 2021-01-02 (×4): 300 mg via ORAL
  Filled 2020-12-31 (×4): qty 1

## 2020-12-31 MED ORDER — OXYCODONE HCL 5 MG/5ML PO SOLN: 5.0000 mg | Freq: Once | ORAL | Status: AC | PRN

## 2020-12-31 MED ORDER — SUGAMMADEX SODIUM 200 MG/2ML IV SOLN
INTRAVENOUS | Status: DC | PRN
  Administered 2020-12-31: 350 mg via INTRAVENOUS

## 2020-12-31 MED ORDER — FENTANYL CITRATE (PF) 250 MCG/5ML IJ SOLN
INTRAMUSCULAR | Status: DC | PRN
  Administered 2020-12-31 (×5): 50 ug via INTRAVENOUS

## 2020-12-31 MED ORDER — VITAMIN D 25 MCG (1000 UNIT) PO TABS
2000.0000 [IU] | ORAL_TABLET | Freq: Two times a day (BID) | ORAL | Status: DC
  Administered 2020-12-31 – 2021-01-02 (×4): 2000 [IU] via ORAL
  Filled 2020-12-31 (×4): qty 2

## 2020-12-31 MED ORDER — BISACODYL 5 MG PO TBEC
5.0000 mg | DELAYED_RELEASE_TABLET | Freq: Every day | ORAL | Status: DC | PRN
Start: 2020-12-31 — End: 2021-01-02
  Administered 2020-12-31: 5 mg via ORAL
  Filled 2020-12-31: qty 1

## 2020-12-31 MED ORDER — HYDROXYZINE HCL 25 MG PO TABS
50.0000 mg | ORAL_TABLET | Freq: Three times a day (TID) | ORAL | Status: DC | PRN
  Administered 2021-01-02: 50 mg via ORAL
  Filled 2020-12-31: qty 2

## 2020-12-31 MED ORDER — HYDROMORPHONE HCL 1 MG/ML IJ SOLN
INTRAMUSCULAR | Status: AC
  Administered 2020-12-31: 0.5 mg via INTRAVENOUS
  Filled 2020-12-31: qty 1

## 2020-12-31 MED ORDER — ONDANSETRON HCL 4 MG/2ML IJ SOLN: 4.0000 mg | Freq: Four times a day (QID) | INTRAMUSCULAR | Status: DC | PRN

## 2020-12-31 MED ORDER — ONDANSETRON HCL 4 MG/2ML IJ SOLN
INTRAMUSCULAR | Status: DC | PRN
  Administered 2020-12-31: 4 mg via INTRAVENOUS

## 2020-12-31 MED ORDER — FENTANYL CITRATE (PF) 100 MCG/2ML IJ SOLN
INTRAMUSCULAR | Status: AC
  Filled 2020-12-31: qty 2

## 2020-12-31 MED ORDER — HYDROMORPHONE HCL 1 MG/ML IJ SOLN
0.2500 mg | INTRAMUSCULAR | Status: DC | PRN
  Administered 2020-12-31 (×3): 0.5 mg via INTRAVENOUS

## 2020-12-31 MED ORDER — PROPOFOL 10 MG/ML IV BOLUS
INTRAVENOUS | Status: DC | PRN
  Administered 2020-12-31: 160 mg via INTRAVENOUS

## 2020-12-31 MED ORDER — LACTATED RINGERS IV SOLN
INTRAVENOUS | Status: DC
Start: 2020-12-31 — End: 2020-12-31

## 2020-12-31 MED ORDER — POLYETHYLENE GLYCOL 3350 17 G PO PACK
17.0000 g | PACK | Freq: Every day | ORAL | Status: DC
  Administered 2020-12-31 – 2021-01-02 (×3): 17 g via ORAL
  Filled 2020-12-31 (×3): qty 1

## 2020-12-31 MED ORDER — OXYCODONE-ACETAMINOPHEN 7.5-325 MG PO TABS
1.0000 | ORAL_TABLET | Freq: Four times a day (QID) | ORAL | Status: DC | PRN
  Administered 2020-12-31 – 2021-01-02 (×5): 2 via ORAL
  Filled 2020-12-31 (×6): qty 2

## 2020-12-31 MED ORDER — METHOCARBAMOL 500 MG PO TABS
ORAL_TABLET | ORAL | Status: AC
  Filled 2020-12-31: qty 2

## 2020-12-31 MED ORDER — OXYCODONE HCL 5 MG PO TABS
ORAL_TABLET | ORAL | Status: AC
  Administered 2020-12-31: 5 mg via ORAL
  Filled 2020-12-31: qty 1

## 2020-12-31 MED ORDER — CEFAZOLIN SODIUM-DEXTROSE 1-4 GM/50ML-% IV SOLN
1.0000 g | Freq: Four times a day (QID) | INTRAVENOUS | Status: AC
  Administered 2021-01-01 (×3): 1 g via INTRAVENOUS
  Filled 2020-12-31 (×3): qty 50

## 2020-12-31 MED ORDER — LACTATED RINGERS IV SOLN: INTRAVENOUS | Status: DC

## 2020-12-31 MED ORDER — POVIDONE-IODINE 10 % EX SWAB
2.0000 "application " | Freq: Once | CUTANEOUS | Status: AC
  Administered 2020-12-31: 2 via TOPICAL

## 2020-12-31 MED ORDER — LIDOCAINE 2% (20 MG/ML) 5 ML SYRINGE
INTRAMUSCULAR | Status: DC | PRN
  Administered 2020-12-31: 80 mg via INTRAVENOUS

## 2020-12-31 SURGICAL SUPPLY — 61 items
BANDAGE ESMARK 6X9 LF (GAUZE/BANDAGES/DRESSINGS) ×1 IMPLANT
BAR EXFX 150X11 NS LF (EXFIX) ×2
BAR EXFX 500X11 NS LF (EXFIX) ×2
BAR GLASS FIBER EXFX 11X150 (EXFIX) ×2 IMPLANT
BAR GLASS FIBER EXFX 11X500 (EXFIX) ×2 IMPLANT
BNDG CMPR 9X6 STRL LF SNTH (GAUZE/BANDAGES/DRESSINGS)
BNDG COHESIVE 6X5 TAN STRL LF (GAUZE/BANDAGES/DRESSINGS) IMPLANT
BNDG ELASTIC 2X5.8 VLCR STR LF (GAUZE/BANDAGES/DRESSINGS) ×1 IMPLANT
BNDG ELASTIC 3X5.8 VLCR STR LF (GAUZE/BANDAGES/DRESSINGS) ×1 IMPLANT
BNDG ELASTIC 4X5.8 VLCR STR LF (GAUZE/BANDAGES/DRESSINGS) ×1 IMPLANT
BNDG ELASTIC 6X5.8 VLCR STR LF (GAUZE/BANDAGES/DRESSINGS) ×1 IMPLANT
BNDG ESMARK 6X9 LF (GAUZE/BANDAGES/DRESSINGS)
BNDG GAUZE ELAST 4 BULKY (GAUZE/BANDAGES/DRESSINGS) ×4 IMPLANT
BRUSH SCRUB EZ PLAIN DRY (MISCELLANEOUS) ×4 IMPLANT
CAP PROTECTIVE TRANSFX 4.5X5MM (EXFIX) ×2 IMPLANT
CLAMP BLUE BAR TO BAR (EXFIX) ×2 IMPLANT
CLAMP BLUE BAR TO PIN (EXFIX) ×4 IMPLANT
COVER SURGICAL LIGHT HANDLE (MISCELLANEOUS) ×3 IMPLANT
COVER WAND RF STERILE (DRAPES) ×1 IMPLANT
CUFF TOURN SGL QUICK 18X4 (TOURNIQUET CUFF) IMPLANT
DRAPE C-ARM 42X72 X-RAY (DRAPES) ×1 IMPLANT
DRAPE C-ARMOR (DRAPES) ×2 IMPLANT
DRAPE U-SHAPE 47X51 STRL (DRAPES) ×1 IMPLANT
DRSG ADAPTIC 3X8 NADH LF (GAUZE/BANDAGES/DRESSINGS) ×1 IMPLANT
DRSG MEPITEL 4X7.2 (GAUZE/BANDAGES/DRESSINGS) ×1 IMPLANT
ELECT REM PT RETURN 9FT ADLT (ELECTROSURGICAL) ×2
ELECTRODE REM PT RTRN 9FT ADLT (ELECTROSURGICAL) ×1 IMPLANT
GAUZE SPONGE 4X4 12PLY STRL (GAUZE/BANDAGES/DRESSINGS) ×2 IMPLANT
GLOVE BIO SURGEON STRL SZ7.5 (GLOVE) ×3 IMPLANT
GLOVE BIO SURGEON STRL SZ8 (GLOVE) ×2 IMPLANT
GLOVE BIOGEL PI IND STRL 7.5 (GLOVE) ×1 IMPLANT
GLOVE BIOGEL PI INDICATOR 7.5 (GLOVE) ×1
GLOVE SRG 8 PF TXTR STRL LF DI (GLOVE) ×1 IMPLANT
GLOVE SURG UNDER POLY LF SZ8 (GLOVE) ×2
GOWN STRL REUS W/ TWL LRG LVL3 (GOWN DISPOSABLE) ×2 IMPLANT
GOWN STRL REUS W/ TWL XL LVL3 (GOWN DISPOSABLE) ×1 IMPLANT
GOWN STRL REUS W/TWL LRG LVL3 (GOWN DISPOSABLE) ×4
GOWN STRL REUS W/TWL XL LVL3 (GOWN DISPOSABLE) ×4
HALF PIN 3MM (EXFIX) ×2 IMPLANT
KIT BASIN OR (CUSTOM PROCEDURE TRAY) ×2 IMPLANT
KIT TURNOVER KIT B (KITS) ×2 IMPLANT
MANIFOLD NEPTUNE II (INSTRUMENTS) ×1 IMPLANT
NEEDLE 22X1 1/2 (OR ONLY) (NEEDLE) IMPLANT
NS IRRIG 1000ML POUR BTL (IV SOLUTION) ×2 IMPLANT
PACK ORTHO EXTREMITY (CUSTOM PROCEDURE TRAY) ×2 IMPLANT
PAD ARMBOARD 7.5X6 YLW CONV (MISCELLANEOUS) ×3 IMPLANT
PAD CAST 3X4 CTTN HI CHSV (CAST SUPPLIES) IMPLANT
PADDING CAST COTTON 3X4 STRL (CAST SUPPLIES) ×2
PADDING CAST COTTON 6X4 STRL (CAST SUPPLIES) ×2 IMPLANT
PADDING UNDERCAST 2 STRL (CAST SUPPLIES) ×1
PADDING UNDERCAST 2X4 STRL (CAST SUPPLIES) IMPLANT
PIN 3MM (EXFIX) ×1 IMPLANT
PIN CLAMP 2BAR 75MM BLUE (EXFIX) ×1 IMPLANT
PIN HALF YELLOW 5X160X35 (EXFIX) ×2 IMPLANT
PIN TRANSFIXING 5.0 (EXFIX) ×1 IMPLANT
SPONGE LAP 18X18 RF (DISPOSABLE) ×2 IMPLANT
STOCKINETTE IMPERVIOUS LG (DRAPES) IMPLANT
STRIP CLOSURE SKIN 1/2X4 (GAUZE/BANDAGES/DRESSINGS) IMPLANT
TOWEL GREEN STERILE (TOWEL DISPOSABLE) ×4 IMPLANT
TOWEL GREEN STERILE FF (TOWEL DISPOSABLE) ×4 IMPLANT
UNDERPAD 30X36 HEAVY ABSORB (UNDERPADS AND DIAPERS) ×2 IMPLANT

## 2020-12-31 NOTE — Anesthesia Procedure Notes (Signed)
Procedure Name: Intubation Date/Time: 12/31/2020 5:15 PM Performed by: Clearnce Sorrel, CRNA Pre-anesthesia Checklist: Patient identified, Emergency Drugs available, Suction available, Patient being monitored and Timeout performed Patient Re-evaluated:Patient Re-evaluated prior to induction Oxygen Delivery Method: Circle system utilized Preoxygenation: Pre-oxygenation with 100% oxygen Induction Type: IV induction Ventilation: Mask ventilation without difficulty and Oral airway inserted - appropriate to patient size Laryngoscope Size: Mac and 3 Grade View: Grade I Tube type: Oral Tube size: 7.0 mm Number of attempts: 1 Airway Equipment and Method: Stylet Placement Confirmation: ETT inserted through vocal cords under direct vision,  positive ETCO2 and breath sounds checked- equal and bilateral Secured at: 22 cm Tube secured with: Tape Dental Injury: Teeth and Oropharynx as per pre-operative assessment

## 2020-12-31 NOTE — H&P (Signed)
[facility] H&P/Consult     Patient ID: Darlene Casey MRN: [id number] DOB/AGE: Aug 16, 1981 40 y.o.  Chief Complaint: Left pilon, tibia and fibula HPI: Darlene Casey is an 40 y.o. female.who fell walking through a ravine. C/o left ankle and right foot pain. Denies other injury, tingling, numbness, loss of motor function but burning and paresthesia.   Past Medical History:  Diagnosis Date  . Fibromyalgia   . GERD (gastroesophageal reflux disease)   . Hepatitis C test positive    2-3 years ago. Pt has had a negative test since then.  . Hypertension   . Schizophrenia [facility])     Past Surgical History:  Procedure Laterality Date  . BREAST SURGERY     breast augmentation  . CHOLECYSTECTOMY    . EUSTACHIAN TUBE DILATION    . TONSILLECTOMY    . TUBAL LIGATION      History reviewed. No pertinent family history. Social History:  reports that she has been smoking. She has been smoking about 0.50 packs per day. She has never used smokeless tobacco. She reports previous alcohol use. She reports current drug use. Drug: Marijuana. Intermittent methamphetamines, Denies current alcohol. ECT treatment for schipzophrenia. 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   Allergies:  Allergies  Allergen Reactions  . Penicillins Nausea And Vomiting and Rash    Other reaction(s): nausea and vomiting, Rash, Rash  . Pregabalin     Other reaction(s): Altered mental status, Hallucinations  . Codeine     Other reaction(s): nausea/vomitting    Medications Prior to Admission  Medication Sig Dispense Refill  . amphetamine-dextroamphetamine (ADDERALL) 5 MG tablet Take 5 mg by mouth daily.    . diclofenac Sodium (VOLTAREN) 1 % GEL Apply 1 application topically daily as needed for pain.    Marland Kitchen ibuprofen (ADVIL) 800 MG tablet Take 800 mg by mouth 3 (three) times daily as needed for pain.      Results for orders placed or performed during the hospital encounter of  12/31/20 (from the past 48 hour(s))  VITAMIN D 25 Hydroxy (Vit-D Deficiency, Fractures)     Status: Abnormal   Collection Time: 12/31/20  1:41 PM  Result Value Ref Range   Vit D, 25-Hydroxy 16.31 (L) 30 - 100 ng/mL    Comment: (NOTE) Vitamin D deficiency has been defined by the Institute of Medicine  and an Endocrine Society practice guideline as a level of serum 25-OH  vitamin D less than 20 ng/mL (1,2). The Endocrine Society went on to  further define vitamin D insufficiency as a level between 21 and 29  ng/mL (2).  1. IOM (Institute of Medicine). 2010. Dietary reference intakes for  calcium and D. [place]: [employer]. 2. Holick MF, Binkley Elmo, Bischoff-Ferrari HA, et al. Evaluation,  treatment, and prevention of vitamin D deficiency: an Endocrine  Society clinical practice guideline, JCEM. 2011 Jul; 96(7): 1911-30.  Performed at [facility], [address], [place], [place] [address]   CBC WITH DIFFERENTIAL     Status: None   Collection Time: 12/31/20  1:41 PM  Result Value Ref Range   WBC 8.7 4.0 - 10.5 K/uL   RBC 4.21 3.87 - 5.11 MIL/uL   Hemoglobin 13.3 12.0 - 15.0 g/dL   HCT 33.0 07.6 - 22.6 %   MCV 93.8 80.0 - 100.0 fL   MCH 31.6 26.0 - 34.0 pg   MCHC 33.7 30.0 - 36.0 g/dL   RDW 12.7  11.5 - 15.5 %   Platelets 229 150 - 400 K/uL   nRBC 0.0 0.0 - 0.2 %   Neutrophils Relative % 66 %   Neutro Abs 5.9 1.7 - 7.7 K/uL   Lymphocytes Relative 21 %   Lymphs Abs 1.8 0.7 - 4.0 K/uL   Monocytes Relative 9 %   Monocytes Absolute 0.8 0.1 - 1.0 K/uL   Eosinophils Relative 2 %   Eosinophils Absolute 0.1 0.0 - 0.5 K/uL   Basophils Relative 1 %   Basophils Absolute 0.1 0.0 - 0.1 K/uL   Immature Granulocytes 1 %   Abs Immature Granulocytes 0.05 0.00 - 0.07 K/uL    Comment: Performed at [facility], [address] [place], [place] [address]  Comprehensive metabolic panel     Status: Abnormal   Collection Time: 12/31/20  1:41 PM  Result Value  Ref Range   Sodium 136 135 - 145 mmol/L   Potassium 3.9 3.5 - 5.1 mmol/L   Chloride 103 98 - 111 mmol/L   CO2 27 22 - 32 mmol/L   Glucose, Bld 105 (H) 70 - 99 mg/dL    Comment: Glucose reference range applies only to samples taken after fasting for at least 8 hours.   BUN 11 6 - 20 mg/dL   Creatinine, Ser 5.40 0.44 - 1.00 mg/dL   Calcium 9.2 8.9 - 08.6 mg/dL   Total Protein 6.8 6.5 - 8.1 g/dL   Albumin 4.0 3.5 - 5.0 g/dL   AST 27 15 - 41 U/L   ALT 24 0 - 44 U/L   Alkaline Phosphatase 60 38 - 126 U/L   Total Bilirubin 1.1 0.3 - 1.2 mg/dL   GFR, Estimated >76 >19 mL/min    Comment: (NOTE) Calculated using the CKD-EPI Creatinine Equation (2021)    Anion gap 6 5 - 15    Comment: Performed at [facility], [address] [place], [place] [address]  Protime-INR     Status: None   Collection Time: 12/31/20  1:41 PM  Result Value Ref Range   Prothrombin Time 13.0 11.4 - 15.2 seconds   INR 1.0 0.8 - 1.2    Comment: (NOTE) INR goal varies based on device and disease states. Performed at [facility], [address] [place], [place] [address]   Pregnancy, urine POC     Status: None   Collection Time: 12/31/20  1:58 PM  Result Value Ref Range   Preg Test, Ur NEGATIVE NEGATIVE    Comment:        THE SENSITIVITY OF THIS METHODOLOGY IS >24 mIU/mL   Urinalysis, Routine w reflex microscopic     Status: Abnormal   Collection Time: 12/31/20  2:22 PM  Result Value Ref Range   Color, Urine AMBER (A) YELLOW    Comment: BIOCHEMICALS MAY BE AFFECTED BY COLOR   APPearance TURBID (A) CLEAR   Specific Gravity, Urine 1.028 1.005 - 1.030   pH 5.0 5.0 - 8.0   Glucose, UA NEGATIVE NEGATIVE mg/dL   Hgb urine dipstick LARGE (A) NEGATIVE   Bilirubin Urine NEGATIVE NEGATIVE   Ketones, ur 5 (A) NEGATIVE mg/dL   Protein, ur 580 (A) NEGATIVE mg/dL   Nitrite NEGATIVE NEGATIVE   Leukocytes,Ua LARGE (A) NEGATIVE   RBC / HPF 21-50 0 - 5 RBC/hpf   WBC, UA >50 (H) 0 - 5 WBC/hpf   Bacteria,  UA RARE (A) NONE SEEN   Squamous Epithelial / LPF 11-20 0 -  5   Mucus PRESENT     Comment: Performed at [facility], [address], [place], [place] [address]  Rapid urine drug screen (hospital performed)     Status: Abnormal   Collection Time: 12/31/20  2:23 PM  Result Value Ref Range   Opiates POSITIVE (A) NONE DETECTED   Cocaine NONE DETECTED NONE DETECTED   Benzodiazepines POSITIVE (A) NONE DETECTED   Amphetamines POSITIVE (A) NONE DETECTED   Tetrahydrocannabinol POSITIVE (A) NONE DETECTED   Barbiturates NONE DETECTED NONE DETECTED    Comment: (NOTE) DRUG SCREEN FOR MEDICAL PURPOSES ONLY.  IF CONFIRMATION IS NEEDED FOR ANY PURPOSE, NOTIFY LAB WITHIN 5 DAYS.  LOWEST DETECTABLE LIMITS FOR URINE DRUG SCREEN Drug Class                     Cutoff (ng/mL) Amphetamine and metabolites    1000 Barbiturate and metabolites    200 Benzodiazepine                 200 Tricyclics and metabolites     300 Opiates and metabolites        300 Cocaine and metabolites        300 THC                            50 Performed at [facility], [address], [place], [place] [address]    No results found.  ROS No recent fever, bleeding abnormalities, urologic dysfunction, GI problems, or weight gain.  Blood pressure 127/60, pulse 81, temperature 97.6 F (36.4 C), temperature source Oral, resp. rate 18, height 5\' 4"  (1.626 m), weight 83 kg, last menstrual period 12/31/2020, SpO2 98 %. Physical Exam NCAT RRR LLE Dressing intact, clean, dry  Edema/ swelling controlled  Sens: DPN, SPN, TN intact  Motor: EHL, FHL, and lessor toe ext and flex all intact grossly  Brisk cap refill, warm to touch RLE ecchymosis and generalized swelling without focal wound  Sens: DPN, SPN, TN intact  Motor: EHL, FHL, and lessor toe ext and flex all intact grossly  Brisk cap refill, warm to touch  Tender lateral malleolus  Excellent and near full ROM  Assessment/Plan  Left tibial pilon, tibia and  fibula with severe impaction and extension up into the shaft Right foot and ankle ecchymosis and lateral mall tenderness  We will x-ray left ankle intra-op and stress the ankle syndesmosis.   I discussed with the patient the risks and benefits of surgery, including the possibility of infection, nerve injury, vessel injury, wound breakdown, arthritis, symptomatic hardware, DVT/ PE, loss of motion, malunion, nonunion, and need for further surgery among others.  We also specifically discussed the need to stage surgery because of the elevated risk of soft tissue breakdown that could lead to amputation.  She acknowledged these risks and wished to proceed.  03/02/2021, MD [facility], [facility] [phone]  12/31/2020, 3:34 PM  [facility] [address] [place] [phone] (F)

## 2020-12-31 NOTE — Transfer of Care (Addendum)
Immediate Anesthesia Transfer of Care Note  Patient: Darlene Casey  Procedure(s) Performed: EXTERNAL FIXATION  ANKLE (Left Ankle)  Patient Location: PACU  Anesthesia Type:General  Level of Consciousness: awake, alert  and oriented  Airway & Oxygen Therapy: Patient Spontanous Breathing  Post-op Assessment: Report given to RN  Post vital signs: Reviewed and stable  Last Vitals:  Vitals Value Taken Time  BP 163/98 12/31/20 1837  Temp    Pulse 109 12/31/20 1839  Resp 18 12/31/20 1839  SpO2 94 % 12/31/20 1839  Vitals shown include unvalidated device data.  Last Pain:  Vitals:   12/31/20 1346  TempSrc:   PainSc: 7       Patients Stated Pain Goal: 3 (12/31/20 1346)  Complications: No complications documented.

## 2020-12-31 NOTE — Anesthesia Preprocedure Evaluation (Addendum)
Anesthesia Evaluation  Patient identified by MRN, date of birth, ID band Patient awake    Reviewed: Allergy & Precautions, NPO status , Patient's Chart, lab work & pertinent test results  History of Anesthesia Complications (+) AWARENESS UNDER ANESTHESIA and history of anesthetic complications (during breast augmentation)  Airway Mallampati: II  TM Distance: >3 FB Neck ROM: Full    Dental  (+) Edentulous Upper, Poor Dentition   Pulmonary neg pulmonary ROS, Current Smoker,    Pulmonary exam normal        Cardiovascular hypertension, Normal cardiovascular exam     Neuro/Psych Depression Bipolar Disorder Schizophrenia negative neurological ROS     GI/Hepatic GERD  ,(+)     substance abuse  marijuana use, Hepatitis - (Ab+), C  Endo/Other  negative endocrine ROS  Renal/GU negative Renal ROS  negative genitourinary   Musculoskeletal  (+) Fibromyalgia -  Abdominal   Peds  Hematology negative hematology ROS (+)   Anesthesia Other Findings   Reproductive/Obstetrics                           Anesthesia Physical Anesthesia Plan  ASA: II  Anesthesia Plan: General   Post-op Pain Management:    Induction: Intravenous  PONV Risk Score and Plan: 3 and Ondansetron, Dexamethasone, Treatment may vary due to age or medical condition and Midazolam  Airway Management Planned: Oral ETT  Additional Equipment: None  Intra-op Plan:   Post-operative Plan: Extubation in OR  Informed Consent: I have reviewed the patients History and Physical, chart, labs and discussed the procedure including the risks, benefits and alternatives for the proposed anesthesia with the patient or authorized representative who has indicated his/her understanding and acceptance.     Dental advisory given  Plan Discussed with:   Anesthesia Plan Comments:        Anesthesia Quick Evaluation

## 2020-12-31 NOTE — Anesthesia Postprocedure Evaluation (Signed)
Anesthesia Post Note  Patient: Darlene Casey  Procedure(s) Performed: EXTERNAL FIXATION  ANKLE (Left Ankle)     Patient location during evaluation: PACU Anesthesia Type: General Level of consciousness: awake and alert, patient cooperative and oriented Pain management: pain level controlled Vital Signs Assessment: post-procedure vital signs reviewed and stable Respiratory status: spontaneous breathing, nonlabored ventilation and respiratory function stable Cardiovascular status: blood pressure returned to baseline and stable Postop Assessment: no apparent nausea or vomiting Anesthetic complications: no   No complications documented.  Last Vitals:  Vitals:   12/31/20 1930 12/31/20 1945  BP: (!) 166/76 (!) 154/76  Pulse: 76 69  Resp: 11 11  Temp:    SpO2: 94% 96%    Last Pain:  Vitals:   12/31/20 1930  TempSrc:   PainSc: 2                  Atiyah Bauer,E. Domonique Cothran

## 2021-01-01 ENCOUNTER — Encounter (HOSPITAL_COMMUNITY): Payer: Self-pay | Admitting: Orthopedic Surgery

## 2021-01-01 ENCOUNTER — Other Ambulatory Visit (HOSPITAL_COMMUNITY): Payer: Self-pay

## 2021-01-01 DIAGNOSIS — E559 Vitamin D deficiency, unspecified: Secondary | ICD-10-CM | POA: Diagnosis present

## 2021-01-01 DIAGNOSIS — F1911 Other psychoactive substance abuse, in remission: Secondary | ICD-10-CM

## 2021-01-01 DIAGNOSIS — K219 Gastro-esophageal reflux disease without esophagitis: Secondary | ICD-10-CM | POA: Diagnosis present

## 2021-01-01 DIAGNOSIS — B1920 Unspecified viral hepatitis C without hepatic coma: Secondary | ICD-10-CM | POA: Diagnosis present

## 2021-01-01 DIAGNOSIS — F209 Schizophrenia, unspecified: Secondary | ICD-10-CM | POA: Diagnosis present

## 2021-01-01 DIAGNOSIS — S82872A Displaced pilon fracture of left tibia, initial encounter for closed fracture: Secondary | ICD-10-CM | POA: Diagnosis not present

## 2021-01-01 DIAGNOSIS — I10 Essential (primary) hypertension: Secondary | ICD-10-CM | POA: Diagnosis present

## 2021-01-01 DIAGNOSIS — S82402A Unspecified fracture of shaft of left fibula, initial encounter for closed fracture: Secondary | ICD-10-CM | POA: Diagnosis present

## 2021-01-01 DIAGNOSIS — F17200 Nicotine dependence, unspecified, uncomplicated: Secondary | ICD-10-CM | POA: Diagnosis present

## 2021-01-01 HISTORY — DX: Vitamin D deficiency, unspecified: E55.9

## 2021-01-01 HISTORY — DX: Unspecified fracture of shaft of left fibula, initial encounter for closed fracture: S82.402A

## 2021-01-01 HISTORY — DX: Nicotine dependence, unspecified, uncomplicated: F17.200

## 2021-01-01 HISTORY — DX: Other psychoactive substance abuse, in remission: F19.11

## 2021-01-01 LAB — CBC
HCT: 36.6 % (ref 36.0–46.0)
Hemoglobin: 12.6 g/dL (ref 12.0–15.0)
MCH: 31.4 pg (ref 26.0–34.0)
MCHC: 34.4 g/dL (ref 30.0–36.0)
MCV: 91.3 fL (ref 80.0–100.0)
Platelets: 217 10*3/uL (ref 150–400)
RBC: 4.01 MIL/uL (ref 3.87–5.11)
RDW: 12.4 % (ref 11.5–15.5)
WBC: 12.3 10*3/uL — ABNORMAL HIGH (ref 4.0–10.5)
nRBC: 0 % (ref 0.0–0.2)

## 2021-01-01 LAB — BASIC METABOLIC PANEL
Anion gap: 10 (ref 5–15)
BUN: 11 mg/dL (ref 6–20)
CO2: 22 mmol/L (ref 22–32)
Calcium: 8.9 mg/dL (ref 8.9–10.3)
Chloride: 104 mmol/L (ref 98–111)
Creatinine, Ser: 0.66 mg/dL (ref 0.44–1.00)
GFR, Estimated: >60 mL/min (ref >60)
Glucose, Bld: 140 mg/dL — ABNORMAL HIGH (ref 70–99)
Potassium: 4 mmol/L (ref 3.5–5.1)
Sodium: 136 mmol/L (ref 135–145)

## 2021-01-01 MED ORDER — HYDROMORPHONE HCL 1 MG/ML IJ SOLN
0.5000 mg | Freq: Three times a day (TID) | INTRAMUSCULAR | Status: DC | PRN
  Administered 2021-01-01 – 2021-01-02 (×2): 1 mg via INTRAVENOUS
  Filled 2021-01-01 (×3): qty 1

## 2021-01-01 MED ORDER — OXYCODONE-ACETAMINOPHEN 7.5-325 MG PO TABS
1.0000 | ORAL_TABLET | Freq: Three times a day (TID) | ORAL | 0 refills | Status: DC | PRN
  Filled 2021-01-01: qty 42, 7d supply, fill #0

## 2021-01-01 MED ORDER — RIVAROXABAN 10 MG PO TABS
10.0000 mg | ORAL_TABLET | Freq: Every day | ORAL | 0 refills | Status: DC
  Filled 2021-01-01: qty 30, 30d supply, fill #0

## 2021-01-01 MED ORDER — DOCUSATE SODIUM 100 MG PO CAPS
100.0000 mg | ORAL_CAPSULE | Freq: Two times a day (BID) | ORAL | 0 refills | Status: AC
  Filled 2021-01-01: qty 30, 15d supply, fill #0

## 2021-01-01 MED ORDER — GABAPENTIN 300 MG PO CAPS
300.0000 mg | ORAL_CAPSULE | Freq: Two times a day (BID) | ORAL | 0 refills | Status: AC
  Filled 2021-01-01: qty 60, 30d supply, fill #0

## 2021-01-01 MED ORDER — METHOCARBAMOL 500 MG PO TABS
500.0000 mg | ORAL_TABLET | Freq: Three times a day (TID) | ORAL | 0 refills | Status: DC | PRN
  Filled 2021-01-01: qty 60, 10d supply, fill #0

## 2021-01-01 MED ORDER — ACETAMINOPHEN 325 MG PO TABS
650.0000 mg | ORAL_TABLET | Freq: Two times a day (BID) | ORAL | 0 refills | Status: DC
  Filled 2021-01-01: qty 60, 15d supply, fill #0

## 2021-01-01 MED ORDER — CHOLECALCIFEROL 125 MCG (5000 UT) PO TABS
ORAL_TABLET | Freq: Every day | ORAL | 5 refills | Status: AC
  Filled 2021-01-01: qty 30, 30d supply, fill #0

## 2021-01-01 MED ORDER — RIVAROXABAN 10 MG PO TABS
10.0000 mg | ORAL_TABLET | Freq: Every day | ORAL | Status: DC
  Administered 2021-01-02: 10 mg via ORAL
  Filled 2021-01-01: qty 1

## 2021-01-01 MED ORDER — ASCORBIC ACID 1000 MG PO TABS
1000.0000 mg | ORAL_TABLET | Freq: Every day | ORAL | 1 refills | Status: AC
  Filled 2021-01-01: qty 30, 30d supply, fill #0

## 2021-01-01 NOTE — Progress Notes (Signed)
Orthopaedic Trauma Service Progress Note  Patient ID: Darlene Casey MRN: [id number] DOB/AGE: 03-30-1981 40 y.o.  Subjective:  Doing well.  Pain feels much better with fixator on No acute issues overnight  Patient lives by herself but states she will stay with her daughter who lives in a ground-level apartment  Patient is on disability due to her schizophrenia  States the only drug that she uses is marijuana.  She does take Adderall.  She does smoke about half pack a day but is willing to quit  Intraoperative x-rays of her right ankle and foot including stress view were negative for acute fractures or syndesmotic injury.  Appears to have ankle sprain given the amount of ecchymosis present  ROS No chest pain or shortness of breath No lightheadedness or dizziness No abdominal pain or nausea  No tingling or sensory changes  Objective:   VITALS:   Vitals:   12/31/20 2221 01/01/21 0210 01/01/21 0630 01/01/21 1048  BP: 126/69 (!) 181/83 (!) 148/81 138/87  Pulse: 70 78 75 71  Resp: 20 18 18 18   Temp: 98.7 F (37.1 C) 98.9 F (37.2 C) 98.3 F (36.8 C) 98.3 F (36.8 C)  TempSrc: Oral Oral Oral   SpO2: 97% 97% 97% 99%  Weight:      Height:        Estimated body mass index is 31.41 kg/m as calculated from the following:   Height as of this encounter: 5\' 4"  (1.626 m).   Weight as of this encounter: 83 kg.   Intake/Output      04/07 0701 04/08 0700 04/08 0701 04/09 0700   P.O. 820 120   I.V. (mL/kg) 1562.3 (18.8) 82.7 (1)   IV Piggyback 100 50   Total Intake(mL/kg) 2482.3 (29.9) 252.7 (3)   Urine (mL/kg/hr) 421    Blood 2    Total Output 423    Net +2059.3 +252.7          LABS  Results for orders placed or performed during the hospital encounter of 12/31/20 (from the past 24 hour(s))  Surgical pcr screen     Status: None   Collection Time: 12/31/20  1:29 PM   Specimen: Nasal Mucosa;  Nasal Swab  Result Value Ref Range   MRSA, PCR NEGATIVE NEGATIVE   Staphylococcus aureus NEGATIVE NEGATIVE  VITAMIN D 25 Hydroxy (Vit-D Deficiency, Fractures)     Status: Abnormal   Collection Time: 12/31/20  1:41 PM  Result Value Ref Range   Vit D, 25-Hydroxy 16.31 (L) 30 - 100 ng/mL  CBC WITH DIFFERENTIAL     Status: None   Collection Time: 12/31/20  1:41 PM  Result Value Ref Range   WBC 8.7 4.0 - 10.5 K/uL   RBC 4.21 3.87 - 5.11 MIL/uL   Hemoglobin 13.3 12.0 - 15.0 g/dL   HCT 03/02/21 03/02/21 - 44.9 %   MCV 93.8 80.0 - 100.0 fL   MCH 31.6 26.0 - 34.0 pg   MCHC 33.7 30.0 - 36.0 g/dL   RDW 20.1 00.7 - 12.1 %   Platelets 229 150 - 400 K/uL   nRBC 0.0 0.0 - 0.2 %   Neutrophils Relative % 66 %   Neutro Abs 5.9 1.7 - 7.7 K/uL   Lymphocytes Relative 21 %   Lymphs Abs 1.8 0.7 - 4.0 K/uL  Monocytes Relative 9 %   Monocytes Absolute 0.8 0.1 - 1.0 K/uL   Eosinophils Relative 2 %   Eosinophils Absolute 0.1 0.0 - 0.5 K/uL   Basophils Relative 1 %   Basophils Absolute 0.1 0.0 - 0.1 K/uL   Immature Granulocytes 1 %   Abs Immature Granulocytes 0.05 0.00 - 0.07 K/uL  Comprehensive metabolic panel     Status: Abnormal   Collection Time: 12/31/20  1:41 PM  Result Value Ref Range   Sodium 136 135 - 145 mmol/L   Potassium 3.9 3.5 - 5.1 mmol/L   Chloride 103 98 - 111 mmol/L   CO2 27 22 - 32 mmol/L   Glucose, Bld 105 (H) 70 - 99 mg/dL   BUN 11 6 - 20 mg/dL   Creatinine, Ser 9.89 0.44 - 1.00 mg/dL   Calcium 9.2 8.9 - 21.1 mg/dL   Total Protein 6.8 6.5 - 8.1 g/dL   Albumin 4.0 3.5 - 5.0 g/dL   AST 27 15 - 41 U/L   ALT 24 0 - 44 U/L   Alkaline Phosphatase 60 38 - 126 U/L   Total Bilirubin 1.1 0.3 - 1.2 mg/dL   GFR, Estimated >94 >17 mL/min   Anion gap 6 5 - 15  Protime-INR     Status: None   Collection Time: 12/31/20  1:41 PM  Result Value Ref Range   Prothrombin Time 13.0 11.4 - 15.2 seconds   INR 1.0 0.8 - 1.2  Pregnancy, urine POC     Status: None   Collection Time: 12/31/20  1:58 PM   Result Value Ref Range   Preg Test, Ur NEGATIVE NEGATIVE  Urinalysis, Routine w reflex microscopic     Status: Abnormal   Collection Time: 12/31/20  2:22 PM  Result Value Ref Range   Color, Urine AMBER (A) YELLOW   APPearance TURBID (A) CLEAR   Specific Gravity, Urine 1.028 1.005 - 1.030   pH 5.0 5.0 - 8.0   Glucose, UA NEGATIVE NEGATIVE mg/dL   Hgb urine dipstick LARGE (A) NEGATIVE   Bilirubin Urine NEGATIVE NEGATIVE   Ketones, ur 5 (A) NEGATIVE mg/dL   Protein, ur 408 (A) NEGATIVE mg/dL   Nitrite NEGATIVE NEGATIVE   Leukocytes,Ua LARGE (A) NEGATIVE   RBC / HPF 21-50 0 - 5 RBC/hpf   WBC, UA >50 (H) 0 - 5 WBC/hpf   Bacteria, UA RARE (A) NONE SEEN   Squamous Epithelial / LPF 11-20 0 - 5   Mucus PRESENT   Rapid urine drug screen (hospital performed)     Status: Abnormal   Collection Time: 12/31/20  2:23 PM  Result Value Ref Range   Opiates POSITIVE (A) NONE DETECTED   Cocaine NONE DETECTED NONE DETECTED   Benzodiazepines POSITIVE (A) NONE DETECTED   Amphetamines POSITIVE (A) NONE DETECTED   Tetrahydrocannabinol POSITIVE (A) NONE DETECTED   Barbiturates NONE DETECTED NONE DETECTED  SARS Coronavirus 2 by RT PCR (hospital order, performed in [facility] hospital lab) Nasopharyngeal Nasopharyngeal Swab     Status: None   Collection Time: 12/31/20  4:02 PM   Specimen: Nasopharyngeal Swab  Result Value Ref Range   SARS Coronavirus 2 NEGATIVE NEGATIVE  CBC     Status: Abnormal   Collection Time: 12/31/20  9:18 PM  Result Value Ref Range   WBC 12.3 (H) 4.0 - 10.5 K/uL   RBC 4.01 3.87 - 5.11 MIL/uL   Hemoglobin 12.5 12.0 - 15.0 g/dL   HCT 14.4 81.8 - 56.3 %  MCV 91.0 80.0 - 100.0 fL   MCH 31.2 26.0 - 34.0 pg   MCHC 34.2 30.0 - 36.0 g/dL   RDW 13.012.5 86.511.5 - 78.415.5 %   Platelets 206 150 - 400 K/uL   nRBC 0.0 0.0 - 0.2 %  Creatinine, serum     Status: None   Collection Time: 12/31/20  9:18 PM  Result Value Ref Range   Creatinine, Ser 0.69 0.44 - 1.00 mg/dL   GFR, Estimated >69>60  >62>60 mL/min  Basic metabolic panel     Status: Abnormal   Collection Time: 01/01/21  5:15 AM  Result Value Ref Range   Sodium 136 135 - 145 mmol/L   Potassium 4.0 3.5 - 5.1 mmol/L   Chloride 104 98 - 111 mmol/L   CO2 22 22 - 32 mmol/L   Glucose, Bld 140 (H) 70 - 99 mg/dL   BUN 11 6 - 20 mg/dL   Creatinine, Ser 9.520.66 0.44 - 1.00 mg/dL   Calcium 8.9 8.9 - 84.110.3 mg/dL   GFR, Estimated >32>60 >44>60 mL/min   Anion gap 10 5 - 15  CBC     Status: Abnormal   Collection Time: 01/01/21  5:15 AM  Result Value Ref Range   WBC 12.3 (H) 4.0 - 10.5 K/uL   RBC 4.01 3.87 - 5.11 MIL/uL   Hemoglobin 12.6 12.0 - 15.0 g/dL   HCT 01.036.6 27.236.0 - 53.646.0 %   MCV 91.3 80.0 - 100.0 fL   MCH 31.4 26.0 - 34.0 pg   MCHC 34.4 30.0 - 36.0 g/dL   RDW 64.412.4 03.411.5 - 74.215.5 %   Platelets 217 150 - 400 K/uL   nRBC 0.0 0.0 - 0.2 %     PHYSICAL EXAM:   Gen: Resting comfortably in bed, appears well, no acute distress Lungs: Unlabored Cardiac:regular  Abd: + BS, NT Ext:       left lower extremity  External fixator is intact  Dressings are clean dry and intact  No drainage appreciated at this time  Moderate swelling to the foot  Extremity is warm  + DP pulse  No DCT  No pain out of proportion with passive stretching of her toes  DPN, SPN, TN sensory functions intact  EHL, FHL, lesser toe motor functions intact  No other acute findings noted  Intraoperative exam was notable for significant swelling to the ankle along with profound fracture blistering medially and laterally.  Assessment/Plan: 1 Day Post-Op   Principal Problem:   Closed left pilon fracture, initial encounter Active Problems:   Hepatitis C test positive   Hypertension   Schizophrenia (HCC)   GERD (gastroesophageal reflux disease)   Closed left fibular fracture   Vitamin D deficiency   Nicotine dependence   History of substance abuse (HCC)   Anti-infectives (From admission, onward)   Start     Dose/Rate Route Frequency Ordered Stop   01/01/21  0600  ceFAZolin (ANCEF) IVPB 2g/100 mL premix        2 g 200 mL/hr over 30 Minutes Intravenous On call to O.R. 12/31/20 1310 12/31/20 1720   01/01/21 0000  ceFAZolin (ANCEF) IVPB 1 g/50 mL premix        1 g 100 mL/hr over 30 Minutes Intravenous Every 6 hours 12/31/20 2030 01/01/21 1759    .  POD/HD#: 21  [age] year old female with complex left pilon fracture, tibia and fibula  -Fall down a ravine while hiking  -Closed left pilon fracture, tibia and fibula with severe intra-articular impaction  s/p external fixation  Nonweightbearing for now and for 8 weeks postop   Patient will require every bit of 2 weeks before proceeding to the OR to maximize soft tissue swelling resolution as well as fracture blister resolution  Encouraged to move toes and knee as much as possible  Elevate ankle above heart as often as possible  Ice  Okay to mobilize with walker or crutches   PT and OT evaluations   Reviewed external fixator care with patient.  She can start this on 01/04/2021.  She can shower with her fixator to ensure her pin sites stay clean.  I encouraged her to read through her discharge paperwork which have comprehensive wound care and pin care plans  -Right ankle sprain  WBAT R leg   Intraoperative films were negative for fracture  If she has discomfort with mobilization is okay to order her an Aircast  Symptomatic management   - Pain management:  Multimodal  - ABL anemia/Hemodynamics  Stable  - Medical issues   Schizophrenia   Stable by patient's report has been doing much better over the last 4 years  History of hepatitis C   Check a hepatitis panel as there is some confusion.  She states that she had subsequent hepatitis test which was negative but I do not see this on the epic system   Vitamin D deficiency   Supplement   Nicotine dependence   Reviewed the negative effects of nicotine on bone and wound healing.  Patient will try to quit.  No patches as these provide a  constant stream of nicotine and actually worse for bone and soft tissue  - DVT/PE prophylaxis:  Start xarelto 10 mg daily   sdcs  Mobilize  - ID:   periop abx  Cephalosporin tolerant   - Metabolic Bone Disease:  Vitamin d deficiency    Supplement  - Activity:  NWB L leg  WBAT R leg   - FEN/GI prophylaxis/Foley/Lines:  Reg dieg  NSL IV   No foley, no pure wick   -Ex-fix/Splint care:  Ok to manipulate leg by fixator  - Impediments to fracture healing:  Nicotine use  Chronic Adderall  Vitamin D deficiency  - Dispo:  Therapy evaluations  Home health needs              Anticipate home tomorrow with daughter   Olivia Mackie [phone] (C) 01/01/2021, 10:50 AM  [facility] [address] [place] [address] [phone] Collier Bullock (F)    After 5pm and on the weekends please log on to Amion, go to orthopaedics and the look under the [facility] Call for the provider(s) on call. You can also call our office at [phone] and then follow the prompts to be connected to the call team.
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
## 2021-01-01 NOTE — Plan of Care (Signed)
  Problem: Health Behavior/Discharge Planning: Goal: Ability to manage health-related needs will improve Outcome: Progressing   Problem: Clinical Measurements: Goal: Ability to maintain clinical measurements within normal limits will improve Outcome: Progressing   Problem: Activity: Goal: Risk for activity intolerance will decrease Outcome: Progressing   Problem: Pain Managment: Goal: General experience of comfort will improve Outcome: Progressing   

## 2021-01-01 NOTE — TOC Transition Note (Addendum)
Transition of Care [facility]) - CM/SW Discharge Note   Patient Details  Name: Darlene Casey MRN: [id number] Date of Birth: Jun 28, 1981  [facility]) CM/SW Contact:  Bess Kinds, RN Phone Number: [phone] 01/01/2021, 1:35 PM   Clinical Narrative:     Notified by OT that patient will need transfer tub bench and 3N1. DME order placed. Referral sent to [employer] for delivery to room.   Spoke with patient at the bedside. RW added to DME order. [employer] made aware.   Patient stated that she has transportation home.   No further TOC needs identified.  Final next level of care: Home/Self Care Barriers to Discharge: No Barriers Identified   Patient Goals and CMS Choice        Discharge Placement                       Discharge Plan and Services                DME Arranged: Tub bench,Bedside commode   Date DME Agency Contacted: 01/01/21 Time DME Agency Contacted: [phone] Representative spoke with at DME Agency: Velna Hatchet            Social Determinants of Health (SDOH) Interventions     Readmission Risk Interventions No flowsheet data found.

## 2021-01-01 NOTE — Progress Notes (Signed)
New Admission Note:   Arrival Method: via stretcher from PACU Mental Orientation: alert and oriented x4 Telemetry: N/A Assessment: to be completed Skin: refer to flowsheet IV: R Hand, infusing KVO Pain: 2/10 Tubes: None Safety Measures: Safety Fall Prevention Plan has been discussed  Admission: to be completed 5 [place] Orientation: Patient has been orientated to the room, unit and staff.   Family: cousin at bedside  Orders to be reviewed and implemented. Will continue to monitor the patient. Call light has been placed within reach and bed alarm has been activated.

## 2021-01-01 NOTE — Discharge Instructions (Addendum)
Orthopaedic Trauma Service Discharge Instructions   General Discharge Instructions  Orthopaedic Injuries:  Left pilon fracture (distal tibia and fibula) treated provisionally with external fixator.  Will need definitive surgery in 2-3 weeks   WEIGHT BEARING STATUS: Nonweightbearing left Leg. Use walker or crutches   RANGE OF MOTION/ACTIVITY: ok to move toes and ankle   Bone health: labs show vitamin d deficiency.  Take vitamin d supplements  Wound Care: see below.  Start daily pin care on 01/04/2021  Discharge Pin Site Instructions  Dress pins daily with Kerlix roll starting on POD 2. Wrap the Kerlix so that it tamps the skin down around the pin-skin interface to prevent/limit motion of the skin relative to the pin.  (Pin-skin motion is the primary cause of pain and infection related to external fixator pin sites).  Remove any crust or coagulum that may obstruct drainage with soap and water.  After POD 3, if there is no discernable drainage on the pin site dressing, the interval for change can by increased to every other day.  You may shower with the fixator, cleaning all pin sites gently with soap and water.  If you have a surgical wound this needs to be completely dry and without drainage before showering. Alternatively you can use a washcloth with soap and water and gently clean the injured extremity and external fixator, including all pinsites and surgical wounds   The extremity can be lifted by the fixator to facilitate wound care and transfers.  Notify the office/Doctor if you experience increasing drainage, redness, or pain from a pin site, or if you notice purulent (thick, snot-like) drainage. As we discussed pin tract infections are common in this is most likely as a result of mechanical irritation from the skin pin interface. Primary treatment is hygiene and cleaning with soap and water. If this does not resolve with regular cleaning contact the office  Discharge Wound Care  Instructions  Do NOT apply any ointments, solutions or lotions to pin sites or surgical wounds.  These prevent needed drainage and even though solutions like hydrogen peroxide kill bacteria, they also damage cells lining the pin sites that help fight infection.  Applying lotions or ointments can keep the wounds moist and can cause them to breakdown and open up as well. This can increase the risk for infection. When in doubt call the office.  Surgical incisions should be dressed daily.  If any drainage is noted, use one layer of adaptic, then gauze, Kerlix, and an ace wrap.  Once the incision is completely dry and without drainage, it may be left open to air out.  Showering may begin 36-48 hours later.  Cleaning gently with soap and water.  Traumatic wounds should be dressed daily as well.    One layer of adaptic, gauze, Kerlix, then ace wrap.  The adaptic can be discontinued once the draining has ceased    If you have a wet to dry dressing: wet the gauze with saline the squeeze as much saline out so the gauze is moist (not soaking wet), place moistened gauze over wound, then place a dry gauze over the moist one, followed by Kerlix wrap, then ace wrap.   DVT/PE prophylaxis: xarelto 10 mg by mouth daily   Diet: as you were eating previously.  Can use over the counter stool softeners and bowel preparations, such as Miralax, to help with bowel movements.  Narcotics can be constipating.  Be sure to drink plenty of fluids  PAIN MEDICATION USE  AND EXPECTATIONS  You have likely been given narcotic medications to help control your pain.  After a traumatic event that results in an fracture (broken bone) with or without surgery, it is ok to use narcotic pain medications to help control one's pain.  We understand that everyone responds to pain differently and each individual patient will be evaluated on a regular basis for the continued need for narcotic medications. Ideally, narcotic medication use should  last no more than 6-8 weeks (coinciding with fracture healing).   As a patient it is your responsibility as well to monitor narcotic medication use and report the amount and frequency you use these medications when you come to your office visit.   We would also advise that if you are using narcotic medications, you should take a dose prior to therapy to maximize you participation.  IF YOU ARE ON NARCOTIC MEDICATIONS IT IS NOT PERMISSIBLE TO OPERATE A MOTOR VEHICLE (MOTORCYCLE/CAR/TRUCK/MOPED) OR HEAVY MACHINERY DO NOT MIX NARCOTICS WITH OTHER CNS (CENTRAL NERVOUS SYSTEM) DEPRESSANTS SUCH AS ALCOHOL   STOP SMOKING OR USING NICOTINE PRODUCTS!!!!  As discussed nicotine severely impairs your body's ability to heal surgical and traumatic wounds but also impairs bone healing.  Wounds and bone heal by forming microscopic blood vessels (angiogenesis) and nicotine is a vasoconstrictor (essentially, shrinks blood vessels).  Therefore, if vasoconstriction occurs to these microscopic blood vessels they essentially disappear and are unable to deliver necessary nutrients to the healing tissue.  This is one modifiable factor that you can do to dramatically increase your chances of healing your injury.    (This means no smoking, no nicotine gum, patches, etc)  DO NOT USE NONSTEROIDAL ANTI-INFLAMMATORY DRUGS (NSAID'S)  Using products such as Advil (ibuprofen), Aleve (naproxen), Motrin (ibuprofen) for additional pain control during fracture healing can delay and/or prevent the healing response.  If you would like to take over the counter (OTC) medication, Tylenol (acetaminophen) is ok.  However, some narcotic medications that are given for pain control contain acetaminophen as well. Therefore, you should not exceed more than 4000 mg of tylenol in a day if you do not have liver disease.  Also note that there are may OTC medicines, such as cold medicines and allergy medicines that my contain tylenol as well.  If you have  any questions about medications and/or interactions please ask your doctor/PA or your pharmacist.      ICE AND ELEVATE INJURED/OPERATIVE EXTREMITY  Using ice and elevating the injured extremity above your heart can help with swelling and pain control.  Icing in a pulsatile fashion, such as 20 minutes on and 20 minutes off, can be followed.    Do not place ice directly on skin. Make sure there is a barrier between to skin and the ice pack.    Using frozen items such as frozen peas works well as the conform nicely to the are that needs to be iced.  USE AN ACE WRAP OR TED HOSE FOR SWELLING CONTROL  In addition to icing and elevation, Ace wraps or TED hose are used to help limit and resolve swelling.  It is recommended to use Ace wraps or TED hose until you are informed to stop.    When using Ace Wraps start the wrapping distally (farthest away from the body) and wrap proximally (closer to the body)   Example: If you had surgery on your leg or thing and you do not have a splint on, start the ace wrap at the toes and work  your way up to the thigh        If you had surgery on your upper extremity and do not have a splint on, start the ace wrap at your fingers and work your way up to the upper arm  IF YOU ARE IN A SPLINT OR CAST DO NOT REMOVE IT FOR ANY REASON   If your splint gets wet for any reason please contact the office immediately. You may shower in your splint or cast as long as you keep it dry.  This can be done by wrapping in a cast cover or garbage back (or similar)  Do Not stick any thing down your splint or cast such as pencils, money, or hangers to try and scratch yourself with.  If you feel itchy take benadryl as prescribed on the bottle for itching  IF YOU ARE IN A CAM BOOT (BLACK BOOT)  You may remove boot periodically. Perform daily dressing changes as noted below.  Wash the liner of the boot regularly and wear a sock when wearing the boot. It is recommended that you sleep in the boot  until told otherwise    Call office for the following:  Temperature greater than 101F  Persistent nausea and vomiting  Severe uncontrolled pain  Redness, tenderness, or signs of infection (pain, swelling, redness, odor or green/yellow discharge around the site)  Difficulty breathing, headache or visual disturbances  Hives  Persistent dizziness or light-headedness  Extreme fatigue  Any other questions or concerns you may have after discharge  In an emergency, call 911 or go to an Emergency Department at a nearby hospital  HELPFUL INFORMATION  ? If you had a block, it will wear off between 8-24 hrs postop typically.  This is period when your pain may go from nearly zero to the pain you would have had postop without the block.  This is an abrupt transition but nothing dangerous is happening.  You may take an extra dose of narcotic when this happens.  ? You should wean off your narcotic medicines as soon as you are able.  Most patients will be off or using minimal narcotics before their first postop appointment.   ? We suggest you use the pain medication the first night prior to going to bed, in order to ease any pain when the anesthesia wears off. You should avoid taking pain medications on an empty stomach as it will make you nauseous.  ? Do not drink alcoholic beverages or take illicit drugs when taking pain medications.  ? In most states it is against the law to drive while you are in a splint or sling.  And certainly against the law to drive while taking narcotics.  ? You may return to work/school in the next couple of days when you feel up to it.   ? Pain medication may make you constipated.  Below are a few solutions to try in this order: - Decrease the amount of pain medication if you aren't having pain. - Drink lots of decaffeinated fluids. - Drink prune juice and/or each dried prunes  o If the first 3 don't work start with additional solutions - Take Colace - an  over-the-counter stool softener - Take Senokot - an over-the-counter laxative - Take Miralax - a stronger over-the-counter laxative     CALL THE OFFICE WITH ANY QUESTIONS OR CONCERNS: [phone]   VISIT OUR WEBSITE FOR ADDITIONAL INFORMATION: [email or web address]   Information on my medicine - XARELTO (Rivaroxaban)  This medication education  was reviewed with me or my healthcare representative as part of my discharge preparation.  Why was Xarelto prescribed for you? Xarelto was prescribed for you to reduce the risk of blood clots forming after orthopedic surgery. The medical term for these abnormal blood clots is venous thromboembolism (VTE).  What do you need to know about xarelto ? Take your Xarelto ONCE DAILY at the same time every day. You may take it either with or without food.  If you have difficulty swallowing the tablet whole, you may crush it and mix in applesauce just prior to taking your dose.  Take Xarelto exactly as prescribed by your doctor and DO NOT stop taking Xarelto without talking to the doctor who prescribed the medication.  Stopping without other VTE prevention medication to take the place of Xarelto may increase your risk of developing a clot.  After discharge, you should have regular check-up appointments with your healthcare provider that is prescribing your Xarelto.    What do you do if you miss a dose? If you miss a dose, take it as soon as you remember on the same day then continue your regularly scheduled once daily regimen the next day. Do not take two doses of Xarelto on the same day.   Important Safety Information A possible side effect of Xarelto is bleeding. You should call your healthcare provider right away if you experience any of the following: ? Bleeding from an injury or your nose that does not stop. ? Unusual colored urine (red or dark brown) or unusual colored stools (red or black). ? Unusual bruising for unknown reasons. ? A  serious fall or if you hit your head (even if there is no bleeding).  Some medicines may interact with Xarelto and might increase your risk of bleeding while on Xarelto. To help avoid this, consult your healthcare provider or pharmacist prior to using any new prescription or non-prescription medications, including herbals, vitamins, non-steroidal anti-inflammatory drugs (NSAIDs) and supplements.  This website has more information on Xarelto: [email or web address].

## 2021-01-01 NOTE — Plan of Care (Signed)
  Problem: Education: Goal: Knowledge of General Education information will improve Description Including pain rating scale, medication(s)/side effects and non-pharmacologic comfort measures Outcome: Progressing   Problem: Health Behavior/Discharge Planning: Goal: Ability to manage health-related needs will improve Outcome: Progressing   

## 2021-01-01 NOTE — Plan of Care (Signed)

## 2021-01-01 NOTE — Evaluation (Signed)
Occupational Therapy Evaluation Patient Details Name: Darlene Casey MRN: [id number] DOB: 09/08/1981 Today's Date: 01/01/2021    History of Present Illness Pt adm 4/7 with complex lt pilon fx of tibia and fibula after a fall down a ravine while hiking. Pt underwent placement of external fixator on 4/7. Pt also with rt ankle sprain. PMH - schizophrenia, htn, fibromyalgia.   Clinical Impression   Pt admitted with the above diagnoses and presents with below problem list. Pt will benefit from continued acute OT to address the below listed deficits and maximize independence with basic ADLs prior to d/c home. At baseline, pt is independent with ADLs, active. Pt currently needs up to min guard assist with LB ADLs, toilet/tub shower transfers, and functional mobility. Discussed technique for shower transfer and LB ADLs.      Follow Up Recommendations  Follow surgeon's recommendation for DC plan and follow-up therapies;Supervision - Intermittent (OOB/mobility)    Equipment Recommendations  Tub/shower bench;3 in 1 bedside commode    Recommendations for Other Services       Precautions / Restrictions Precautions Precautions: Fall Restrictions Weight Bearing Restrictions: Yes LLE Weight Bearing: Non weight bearing      Mobility Bed Mobility               General bed mobility comments: up in recliner    Transfers Overall transfer level: Needs assistance Equipment used: Rolling walker (2 wheeled) Transfers: Sit to/from Stand Sit to Stand: Supervision;Min guard         General transfer comment: to/from recliner height, Pt with good hand placement and recall of technique with rw from prior PT session.    Balance Overall balance assessment: Needs assistance Sitting-balance support: No upper extremity supported Sitting balance-Leahy Scale: Good     Standing balance support: Bilateral upper extremity supported Standing balance-Leahy Scale: Poor Standing balance comment:  external support needed due to NWB LLE status                           ADL either performed or assessed with clinical judgement   ADL Overall ADL's : Needs assistance/impaired Eating/Feeding: Set up;Sitting   Grooming: Set up;Sitting   Upper Body Bathing: Set up;Sitting   Lower Body Bathing: Min guard;Sit to/from stand   Upper Body Dressing : Set up;Sitting   Lower Body Dressing: Min guard;Sit to/from stand   Toilet Transfer: Min guard;Ambulation;RW (3n1 over toilet)   Toileting- Clothing Manipulation and Hygiene: Min guard;Sitting/lateral lean;Sit to/from stand   Tub/ Shower Transfer: Min guard;Tub transfer;Ambulation;Rolling walker Tub/Shower Transfer Details (indicate cue type and reason): Discussed a couple of different techniques for tub transfer based on what DME she will have. Pt is able to offload onto BUE with no LE support. Advised to have someone with her if she is trying the 3n1 as tub shower seat method. Also discussed tub bench or just sponge bathing until she feels safe to do a tub shower transfer. Functional mobility during ADLs: Min guard;Rolling walker General ADL Comments: Educated on LB ADL technique, shower tranfer techniques, safety with ADLs and mobility.     Vision         Perception     Praxis      Pertinent Vitals/Pain Pain Assessment: Faces Faces Pain Scale: Hurts a little bit Pain Location: LLE Pain Descriptors / Indicators: Sore Pain Intervention(s): Monitored during session     Hand Dominance     Extremity/Trunk Assessment Upper Extremity Assessment Upper Extremity Assessment: Overall  WFL for tasks assessed   Lower Extremity Assessment Lower Extremity Assessment: Defer to PT evaluation       Communication Communication Communication: No difficulties   Cognition Arousal/Alertness: Awake/alert Behavior During Therapy: WFL for tasks assessed/performed Overall Cognitive Status: Within Functional Limits for tasks  assessed                                     General Comments       Exercises     Shoulder Instructions      Home Living Family/patient expects to be discharged to:: Private residence Living Arrangements: Alone (Pt will stay with daughter on dc) Available Help at Discharge: Family         Home Layout: One level     Bathroom Shower/Tub: Chief Strategy Officer: Standard     Home Equipment: Crutches          Prior Functioning/Environment Level of Independence: Independent                 OT Problem List: Impaired balance (sitting and/or standing);Decreased knowledge of use of DME or AE;Decreased knowledge of precautions;Pain      OT Treatment/Interventions: Self-care/ADL training;DME and/or AE instruction;Therapeutic activities;Patient/family education;Balance training    OT Goals(Current goals can be found in the care plan section) Acute Rehab OT Goals Patient Stated Goal: home OT Goal Formulation: With patient Time For Goal Achievement: 01/15/21 Potential to Achieve Goals: Good ADL Goals Pt Will Perform Lower Body Dressing: with modified independence;sit to/from stand Pt Will Transfer to Toilet: with modified independence;ambulating Pt Will Perform Tub/Shower Transfer: Tub transfer;with set-up;ambulating;tub bench;rolling walker  OT Frequency: Min 2X/week   Barriers to D/C:            Co-evaluation              AM-PAC OT "6 Clicks" Daily Activity     Outcome Measure Help from another person eating meals?: None Help from another person taking care of personal grooming?: None Help from another person toileting, which includes using toliet, bedpan, or urinal?: A Little Help from another person bathing (including washing, rinsing, drying)?: A Little Help from another person to put on and taking off regular upper body clothing?: None Help from another person to put on and taking off regular lower body clothing?: A  Little 6 Click Score: 21   End of Session Equipment Utilized During Treatment: Gait belt;Rolling walker  Activity Tolerance: Patient tolerated treatment well Patient left: in chair;with call bell/phone within reach;with family/visitor present  OT Visit Diagnosis: Unsteadiness on feet (R26.81);Pain Pain - Right/Left: Left Pain - part of body: Leg;Ankle and joints of foot                Time: 4098-1191 OT Time Calculation (min): 19 min Charges:  OT General Charges $OT Visit: 1 Visit OT Evaluation $OT Eval Low Complexity: 1 Low  Raynald Kemp, OT Acute Rehabilitation Services Pager: [phone] Office: [phone]   Pilar Grammes 01/01/2021, 1:35 PM

## 2021-01-01 NOTE — Progress Notes (Signed)
Orthopedic Tech Progress Note Patient Details:  Darlene Casey [birth year] [id number]   Applied overhead bar with trapeze  Maurene Capes 01/01/2021, 2:13 AM

## 2021-01-02 ENCOUNTER — Encounter (HOSPITAL_COMMUNITY): Payer: Self-pay | Admitting: Orthopedic Surgery

## 2021-01-02 DIAGNOSIS — S82872A Displaced pilon fracture of left tibia, initial encounter for closed fracture: Secondary | ICD-10-CM | POA: Diagnosis not present

## 2021-01-02 LAB — BASIC METABOLIC PANEL
Anion gap: 7 (ref 5–15)
BUN: 10 mg/dL (ref 6–20)
CO2: 26 mmol/L (ref 22–32)
Calcium: 8.3 mg/dL — ABNORMAL LOW (ref 8.9–10.3)
Chloride: 105 mmol/L (ref 98–111)
Creatinine, Ser: 0.66 mg/dL (ref 0.44–1.00)
GFR, Estimated: >60 mL/min (ref >60)
Glucose, Bld: 135 mg/dL — ABNORMAL HIGH (ref 70–99)
Potassium: 3.3 mmol/L — ABNORMAL LOW (ref 3.5–5.1)
Sodium: 138 mmol/L (ref 135–145)

## 2021-01-02 LAB — CBC
HCT: 31.9 % — ABNORMAL LOW (ref 36.0–46.0)
Hemoglobin: 11 g/dL — ABNORMAL LOW (ref 12.0–15.0)
MCH: 32 pg (ref 26.0–34.0)
MCHC: 34.5 g/dL (ref 30.0–36.0)
MCV: 92.7 fL (ref 80.0–100.0)
Platelets: 210 10*3/uL (ref 150–400)
RBC: 3.44 MIL/uL — ABNORMAL LOW (ref 3.87–5.11)
RDW: 12.7 % (ref 11.5–15.5)
WBC: 8.2 10*3/uL (ref 4.0–10.5)
nRBC: 0 % (ref 0.0–0.2)

## 2021-01-02 LAB — URINE CULTURE: Culture: NO GROWTH

## 2021-01-02 LAB — HEPATITIS PANEL, ACUTE
HCV Ab: REACTIVE — AB
Hep A IgM: NONREACTIVE
Hep B C IgM: NONREACTIVE
Hepatitis B Surface Ag: NONREACTIVE

## 2021-01-02 MED ORDER — HYDROMORPHONE HCL 1 MG/ML IJ SOLN
1.0000 mg | Freq: Once | INTRAMUSCULAR | Status: AC
Start: 2021-01-02 — End: 2021-01-02
  Administered 2021-01-02: 1 mg via INTRAVENOUS
  Filled 2021-01-02: qty 1

## 2021-01-02 NOTE — Evaluation (Signed)
Occupational Therapy Evaluation Patient Details Name: Darlene Casey MRN: [id number] DOB: 03-26-1981 Today's Date: 01/02/2021    History of Present Illness Pt adm 4/7 with complex lt pilon fx of tibia and fibula after a fall down a ravine on 4[place]3 while hiking. Pt underwent placement of external fixator on 4/7. Pt also with rt ankle sprain. PMH - schizophrenia, htn, fibromyalgia.   Clinical Impression   Pt simulated tub transfers and was able to complete with min guard with cues on positioning self. Pt was educated on modifications in clothing to be able to don over LLE with return to home and AE to use. Pt was able to complete transfers and ambulation with RW with supervision from bed level as cues to pace self. Pt voiced an understanding of WB status at this time with completion of ADLs and modifications at home level to be able to follow. Pt plans to go to her daughter's home for a couple of days then return to her home.        Follow Up Recommendations  Follow surgeon's recommendation for DC plan and follow-up therapies;Supervision - Intermittent    Equipment Recommendations  Tub/shower bench;3 in 1 bedside commode    Recommendations for Other Services       Precautions / Restrictions Precautions Precautions: Fall Restrictions Weight Bearing Restrictions: Yes LLE Weight Bearing: Non weight bearing      Mobility Bed Mobility Overal bed mobility: Independent                  Transfers Overall transfer level: Needs assistance Equipment used: Rolling walker (2 wheeled) Transfers: Sit to/from Stand Sit to Stand: Supervision;Modified independent (Device/Increase time)         General transfer comment: cues pacing to slow    Balance Overall balance assessment: No apparent balance deficits (not formally assessed) Sitting-balance support: No upper extremity supported Sitting balance-Leahy Scale: Good     Standing balance support: Bilateral upper extremity  supported Standing balance-Leahy Scale: Poor Standing balance comment: external support needed due to NWB LLE status                           ADL either performed or assessed with clinical judgement   ADL Overall ADL's : Needs assistance/impaired Eating/Feeding: Set up;Sitting   Grooming: Set up;Sitting   Upper Body Bathing: Set up;Sitting   Lower Body Bathing: Set up;Sit to/from stand   Upper Body Dressing : Set up;Sitting   Lower Body Dressing: Set up;Sit to/from stand   Toilet Transfer: Solicitor;Ambulation   Toileting- Clothing Manipulation and Hygiene: Min guard;Sitting/lateral lean;Sit to/from stand   Tub/ Shower Transfer: Min guard;Tub transfer;Ambulation;Rolling walker   Functional mobility during ADLs: Min guard;Rolling walker General ADL Comments: pt simulated on how they can complete tub transfers with transfer bench and LE dressing with AE     Vision   Vision Assessment?: No apparent visual deficits     Perception     Praxis      Pertinent Vitals/Pain Pain Assessment: 0-10 Pain Score: 7  Pain Location: LLE Pain Descriptors / Indicators: Throbbing Pain Intervention(s): Limited activity within patient's tolerance;Monitored during session;Repositioned;Ice applied     Hand Dominance     Extremity/Trunk Assessment Upper Extremity Assessment Upper Extremity Assessment: Overall WFL for tasks assessed   Lower Extremity Assessment Lower Extremity Assessment: Defer to PT evaluation LLE Deficits / Details: Pt with external fix and limited by pain but can actively move against gravity  Communication     Cognition Arousal/Alertness: Awake/alert Behavior During Therapy: WFL for tasks assessed/performed Overall Cognitive Status: Within Functional Limits for tasks assessed                                 General Comments: ot was cued for pacing as they attempt to make quick movments in session in standing  position   General Comments       Exercises     Shoulder Instructions      Home Living                                          Prior Functioning/Environment                   OT Problem List:        OT Treatment/Interventions:      OT Goals(Current goals can be found in the care plan section) Acute Rehab OT Goals Patient Stated Goal: home OT Goal Formulation: With patient Time For Goal Achievement: 01/15/21 Potential to Achieve Goals: Good ADL Goals Pt Will Perform Lower Body Dressing: with modified independence;sit to/from stand Pt Will Transfer to Toilet: with modified independence;ambulating Pt Will Perform Tub/Shower Transfer: Tub transfer;with set-up;ambulating;tub bench;rolling walker  OT Frequency: Min 2X/week   Barriers to D/C:            Co-evaluation              AM-PAC OT "6 Clicks" Daily Activity     Outcome Measure Help from another person eating meals?: None Help from another person taking care of personal grooming?: None Help from another person toileting, which includes using toliet, bedpan, or urinal?: A Little Help from another person bathing (including washing, rinsing, drying)?: A Little Help from another person to put on and taking off regular upper body clothing?: None Help from another person to put on and taking off regular lower body clothing?: A Little 6 Click Score: 21   End of Session Equipment Utilized During Treatment: Gait belt;Rolling walker  Activity Tolerance: Patient tolerated treatment well Patient left: with call bell/phone within reach;with family/visitor present;in bed  OT Visit Diagnosis: Unsteadiness on feet (R26.81);Pain Pain - Right/Left: Left Pain - part of body: Leg;Ankle and joints of foot                Time: 0922-0952 OT Time Calculation (min): 30 min Charges:  OT General Charges $OT Visit: 1 Visit OT Treatments $Self Care/Home Management : 23-37 mins  Alphia Moh OTR/L   Acute Rehab Services  [phone] office number [phone] pager number   Alphia Moh 01/02/2021, 11:02 AM

## 2021-01-02 NOTE — Progress Notes (Signed)
DISCHARGE NOTE HOME Chaia Ikard to be discharged Home per MD order. Discussed prescriptions and follow up appointments with the patient. Prescriptions given to patient; medication list explained in detail. Patient verbalized understanding.  Skin clean, dry and intact without evidence of skin break down, no evidence of skin tears noted. IV catheter discontinued intact. Site without signs and symptoms of complications. Dressing and pressure applied. Pt denies pain at the site currently. No complaints noted.  Patient free of lines, drains, and wounds. Pt DC with external fixator on  Left ankle/ffoot.  An After Visit Summary (AVS) was printed and given to the patient. Patient escorted via wheelchair, and discharged home via private auto.  Leonia Reeves, RN, BSN

## 2021-01-02 NOTE — TOC Transition Note (Signed)
Transition of Care [facility]) - CM/SW Discharge Note   Patient Details  Name: Sihaam Chrobak MRN: [id number] Date of Birth: 08/17/1981  [facility]) CM/SW Contact:  Kallie Locks, RN Phone Number: [phone] 01/02/2021, 9:55 AM   Clinical Narrative:   Spoke with patient who confirms DME ( 3 in 1, tub bench, RW) was delivered to room.   States her ride will come to pick her up later.   No further needs assessed.      Final next level of care: Home/Self Care Barriers to Discharge: No Barriers Identified   Patient Goals and CMS Choice Patient states their goals for this hospitalization and ongoing recovery are:: return home      Discharge Placement                       Discharge Plan and Services                DME Arranged: Tub bench,Bedside commode   Date DME Agency Contacted: 01/01/21 Time DME Agency Contacted: [phone] Representative spoke with at DME Agency: Velna Hatchet            Social Determinants of Health (SDOH) Interventions     Readmission Risk Interventions No flowsheet data found.    Raiford Noble, MSN, RN,BSN [facility]patient [facility] Case Manager [phone]

## 2021-01-02 NOTE — Discharge Summary (Signed)
SPORTS MEDICINE & JOINT REPLACEMENT   Georgena Spurling, MD   Laurier Nancy, PA-C [address] [place], [place], [place]  [address]                             [phone]  PATIENT ID: Darlene Casey        MRN:  [id number]          DOB/AGE: 1981-04-24 / 40 y.o.    DISCHARGE SUMMARY  ADMISSION DATE:    12/31/2020 DISCHARGE DATE:   01/02/2021   ADMISSION DIAGNOSIS: Leg fracture [S82.90XA] Closed left pilon fracture, initial encounter [S82.872A]    DISCHARGE DIAGNOSIS:  ANKLE FRACTURE, left    ADDITIONAL DIAGNOSIS: Principal Problem:   Closed left pilon fracture, initial encounter Active Problems:   Hepatitis C test positive   Hypertension   Schizophrenia (HCC)   GERD (gastroesophageal reflux disease)   Closed left fibular fracture   Vitamin D deficiency   Nicotine dependence   History of substance abuse (HCC)  Past Medical History:  Diagnosis Date  . Closed left fibular fracture 01/01/2021  . Fibromyalgia   . GERD (gastroesophageal reflux disease)   . Hepatitis C test positive    2-3 years ago. Pt has had a negative test since then.  . History of substance abuse (HCC) 01/01/2021  . Hypertension   . Nicotine dependence 01/01/2021  . Schizophrenia (HCC)   . Vitamin D deficiency 01/01/2021    PROCEDURE: Procedure(s): EXTERNAL FIXATION  ANKLE on 12/31/2020  CONSULTS:    HISTORY:  See H&P in chart  HOSPITAL COURSE:  Darlene Casey is a 40 y.o. admitted on 12/31/2020 and found to have a diagnosis of ANKLE FRACTURE, left.  After appropriate laboratory studies were obtained  they were taken to the operating room on 12/31/2020 and underwent Procedure(s): EXTERNAL FIXATION  ANKLE.   They were given perioperative antibiotics:  Anti-infectives (From admission, onward)   Start     Dose/Rate Route Frequency Ordered Stop   01/01/21 0600  ceFAZolin (ANCEF) IVPB 2g/100 mL premix        2 g 200 mL/hr over 30 Minutes Intravenous On call to O.R. 12/31/20 1310 12/31/20 1720   01/01/21 0000   ceFAZolin (ANCEF) IVPB 1 g/50 mL premix        1 g 100 mL/hr over 30 Minutes Intravenous Every 6 hours 12/31/20 2030 01/01/21 1900    .  Patient given tranexamic acid IV or topical and exparel intra-operatively.  Tolerated the procedure well.    POD# 1: Vital signs were stable.  Patient denied Chest pain, shortness of breath, or calf pain.  Patient was started on Aspirin twice daily at 8am.  Consults to PT, OT, and care management were made.  The patient was weight bearing as tolerated.  CPM was placed on the operative leg 0-90 degrees for 6-8 hours a day. When out of the CPM, patient was placed in the foam block to achieve full extension. Incentive spirometry was taught.  Dressing was changed.       POD #2, Continued  PT for ambulation and exercise program.  IV saline locked.  O2 discontinued.    The remainder of the hospital course was dedicated to ambulation and strengthening.   The patient was discharged on 2 Days Post-Op in  Good condition.  Blood products given:none  DIAGNOSTIC STUDIES: Recent vital signs:  Patient Vitals for the past 24 hrs:  BP Temp Temp src Pulse Resp SpO2  Weight  01/02/21 0403 (!) 160/94 98.3 F (36.8 C) Oral 65 18 97 % 89 kg  01/01/21 1939 (!) 156/73 98.1 F (36.7 C) Oral 75 16 99 % --  01/01/21 1647 (!) 181/77 98.3 F (36.8 C) Oral 81 20 97 % --  01/01/21 1048 138/87 98.3 F (36.8 C) -- 71 18 99 % --       Recent laboratory studies: Recent Labs    12/31/20 1341 01/01/21 0515 01/02/21 0553  WBC 8.7 12.3* 8.2  HGB 13.3 12.6 11.0*  HCT 39.5 36.6 31.9*  PLT 229 217 210   Recent Labs    12/31/20 1341 01/01/21 0515 01/02/21 0553  NA 136 136 138  K 3.9 4.0 3.3*  CL 103 104 105  CO2 27 22 26   BUN 11 11 10   CREATININE 0.73 0.66 0.66  GLUCOSE 105* 140* 135*  CALCIUM 9.2 8.9 8.3*   Lab Results  Component Value Date   INR 1.0 12/31/2020     Recent Radiographic Studies :  DG Ankle Complete Left  Result Date: 12/31/2020 CLINICAL DATA:   Post external fixator placement EXAM: LEFT ANKLE COMPLETE - 3+ VIEW COMPARISON:  12/29/2020 FINDINGS: Interval postoperative changes with placement of external fixators extending from the proximal left tibia to the tarsal bones. Multiple E comminuted fractures of the distal tibia and fibula are again demonstrated with improved alignment compared with prior study. Residual cortical defect at the distal tibial and fibular articular surfaces. IMPRESSION: Interval placement of external fixators. Improved alignment of comminuted fractures of the distal tibia and fibula. Electronically Signed   By: 03/02/2021 M.D.   On: 12/31/2020 20:02   DG Ankle Complete Left  Result Date: 12/31/2020 CLINICAL DATA:  External fixation left ankle. EXAM: LEFT ANKLE COMPLETE - 3+ VIEW; DG C-ARM 1-60 MIN COMPARISON:  Radiograph 12/29/2020 FINDINGS: Three fluoroscopic spot views of the left ankle obtained in the operating room in frontal, lateral, and oblique projections. Distal tibia and fibular fractures are again seen. External fixator is seen on the lateral view with pain tentatively seen in the calcaneus, though not entirely included in the field of view. Total fluoroscopy time 32 seconds. Total dose 1.17 mGy. IMPRESSION: Intraoperative fluoroscopy during left ankle fracture fixation. Electronically Signed   By: 03/02/2021 M.D.   On: 12/31/2020 19:07   CT ANKLE LEFT WO CONTRAST  Result Date: 01/01/2021 CLINICAL DATA:  Left ankle pilon fracture EXAM: CT OF THE LEFT ANKLE WITHOUT CONTRAST 3-DIMENSIONAL CT IMAGE RENDERING ON ACQUISITION WORKSTATION TECHNIQUE: Multidetector CT imaging of the left ankle was performed according to the standard protocol. Multiplanar CT image reconstructions were also generated. 3-dimensional CT images were rendered by post-processing of the original CT data on an acquisition workstation. The 3-dimensional CT images were interpreted and findings were reported in the accompanying complete CT  report for this study COMPARISON:  Radiographs 12/31/2020 FINDINGS: Bones/Joint/Cartilage The acquisition workstation 3 D images are suboptimal and were supplemented by radiologist generated images in TeraRecon. The lower leg is in external fixation. External fixators extend through the calcaneal tuberosity and 5th metatarsal diaphysis. There is an extensively comminuted intra-articular fracture of distal tibia. This has an oblique component extending into the distal diaphysis, approximately 10.8 cm proximal to the tibial plafond. This component of the fracture demonstrates only 3 mm of displacement. Distally, there is significant comminution of the intra-articular portion of the fracture with up to 7 mm depression of the articular surface of the tibial plafond anteriorly. The intra-articular portion of  the fracture extends into the distal tibiofibular joint. The medial malleolus is intact. There is a comminuted and mildly displaced fracture of the distal fibula which extends approximately 2.5 cm proximal to the ankle mortise. The ankle mortise appears mildly widened, and there is an ankle joint effusion with several small intra-articular fracture fragments. There may be a minimal avulsion fracture along the medial aspect of the talar body (coronal image 41/8), although the talar dome itself appears intact. The additional tarsal bones appear intact. Type 1 accessory navicular. Ligaments Suboptimally assessed by CT. Muscles and Tendons The ankle tendons appear intact without rupture or entrapment. The tibialis anterior tendon approximates the comminuted fracture of the distal tibial diaphysis. The peroneal tendons approximate the comminuted fracture of the distal fibula. No intramuscular fluid collections are seen. Soft tissues Suspected areas of skin blistering in the lateral ankle and medial hindfoot. No unexpected foreign body or soft tissue emphysema. IMPRESSION: 1. Comminuted intra-articular fracture of the  distal tibia as described. There is an oblique component extending proximally into the distal tibial diaphysis. There is significant depression of the articular surface of the tibial plafond anteriorly. 2. Comminuted and mildly displaced fracture of the distal fibula. Mild widening of the ankle mortise. 3. Small fracture fragments in the tibiotalar joint with possible avulsion injury of the talar body medially. No gross talar dome or other tarsal bone injury. 4. No evidence of ankle tendon rupture or entrapment. Electronically Signed   By: Carey Bullocks M.D.   On: 01/01/2021 09:45   CT 3D RECON AT SCANNER  Result Date: 01/01/2021 CLINICAL DATA:  Left ankle pilon fracture EXAM: CT OF THE LEFT ANKLE WITHOUT CONTRAST 3-DIMENSIONAL CT IMAGE RENDERING ON ACQUISITION WORKSTATION TECHNIQUE: Multidetector CT imaging of the left ankle was performed according to the standard protocol. Multiplanar CT image reconstructions were also generated. 3-dimensional CT images were rendered by post-processing of the original CT data on an acquisition workstation. The 3-dimensional CT images were interpreted and findings were reported in the accompanying complete CT report for this study COMPARISON:  Radiographs 12/31/2020 FINDINGS: Bones/Joint/Cartilage The acquisition workstation 3 D images are suboptimal and were supplemented by radiologist generated images in TeraRecon. The lower leg is in external fixation. External fixators extend through the calcaneal tuberosity and 5th metatarsal diaphysis. There is an extensively comminuted intra-articular fracture of distal tibia. This has an oblique component extending into the distal diaphysis, approximately 10.8 cm proximal to the tibial plafond. This component of the fracture demonstrates only 3 mm of displacement. Distally, there is significant comminution of the intra-articular portion of the fracture with up to 7 mm depression of the articular surface of the tibial plafond  anteriorly. The intra-articular portion of the fracture extends into the distal tibiofibular joint. The medial malleolus is intact. There is a comminuted and mildly displaced fracture of the distal fibula which extends approximately 2.5 cm proximal to the ankle mortise. The ankle mortise appears mildly widened, and there is an ankle joint effusion with several small intra-articular fracture fragments. There may be a minimal avulsion fracture along the medial aspect of the talar body (coronal image 41/8), although the talar dome itself appears intact. The additional tarsal bones appear intact. Type 1 accessory navicular. Ligaments Suboptimally assessed by CT. Muscles and Tendons The ankle tendons appear intact without rupture or entrapment. The tibialis anterior tendon approximates the comminuted fracture of the distal tibial diaphysis. The peroneal tendons approximate the comminuted fracture of the distal fibula. No intramuscular fluid collections are seen. Soft tissues  Suspected areas of skin blistering in the lateral ankle and medial hindfoot. No unexpected foreign body or soft tissue emphysema. IMPRESSION: 1. Comminuted intra-articular fracture of the distal tibia as described. There is an oblique component extending proximally into the distal tibial diaphysis. There is significant depression of the articular surface of the tibial plafond anteriorly. 2. Comminuted and mildly displaced fracture of the distal fibula. Mild widening of the ankle mortise. 3. Small fracture fragments in the tibiotalar joint with possible avulsion injury of the talar body medially. No gross talar dome or other tarsal bone injury. 4. No evidence of ankle tendon rupture or entrapment. Electronically Signed   By: Carey Bullocks M.D.   On: 01/01/2021 09:45   DG C-Arm 1-60 Min  Result Date: 12/31/2020 CLINICAL DATA:  External fixation left ankle. EXAM: LEFT ANKLE COMPLETE - 3+ VIEW; DG C-ARM 1-60 MIN COMPARISON:  Radiograph 12/29/2020  FINDINGS: Three fluoroscopic spot views of the left ankle obtained in the operating room in frontal, lateral, and oblique projections. Distal tibia and fibular fractures are again seen. External fixator is seen on the lateral view with pain tentatively seen in the calcaneus, though not entirely included in the field of view. Total fluoroscopy time 32 seconds. Total dose 1.17 mGy. IMPRESSION: Intraoperative fluoroscopy during left ankle fracture fixation. Electronically Signed   By: Narda Rutherford M.D.   On: 12/31/2020 19:07    DISCHARGE INSTRUCTIONS: Discharge Instructions    Call MD / Call 911   Complete by: As directed    If you experience chest pain or shortness of breath, CALL 911 and be transported to the hospital emergency room.  If you develope a fever above 101 F, pus (white drainage) or increased drainage or redness at the wound, or calf pain, call your surgeon's office.   Call MD / Call 911   Complete by: As directed    If you experience chest pain or shortness of breath, CALL 911 and be transported to the hospital emergency room.  If you develope a fever above 101 F, pus (white drainage) or increased drainage or redness at the wound, or calf pain, call your surgeon's office.   Constipation Prevention   Complete by: As directed    Drink plenty of fluids.  Prune juice may be helpful.  You may use a stool softener, such as Colace (over the counter) 100 mg twice a day.  Use MiraLax (over the counter) for constipation as needed.   Constipation Prevention   Complete by: As directed    Drink plenty of fluids.  Prune juice may be helpful.  You may use a stool softener, such as Colace (over the counter) 100 mg twice a day.  Use MiraLax (over the counter) for constipation as needed.   Diet - low sodium heart healthy   Complete by: As directed    Diet general   Complete by: As directed    Discharge instructions   Complete by: As directed    Orthopaedic Trauma Service Discharge  Instructions   General Discharge Instructions  Orthopaedic Injuries:  Left pilon fracture (distal tibia and fibula) treated provisionally with external fixator.  Will need definitive surgery in 2-3 weeks   WEIGHT BEARING STATUS: Nonweightbearing left Leg. Use walker or crutches   RANGE OF MOTION/ACTIVITY: ok to move toes and ankle   Bone health: labs show vitamin d deficiency.  Take vitamin d supplements  Wound Care: see below.  Start daily pin care on 01/04/2021  Discharge Pin Site Instructions  Dress pins daily with Kerlix roll starting on POD 2. Wrap the Kerlix so that it tamps the skin down around the pin-skin interface to prevent/limit motion of the skin relative to the pin.  (Pin-skin motion is the primary cause of pain and infection related to external fixator pin sites).  Remove any crust or coagulum that may obstruct drainage with soap and water.  After POD 3, if there is no discernable drainage on the pin site dressing, the interval for change can by increased to every other day.  You may shower with the fixator, cleaning all pin sites gently with soap and water.  If you have a surgical wound this needs to be completely dry and without drainage before showering. Alternatively you can use a washcloth with soap and water and gently clean the injured extremity and external fixator, including all pinsites and surgical wounds   The extremity can be lifted by the fixator to facilitate wound care and transfers.  Notify the office/Doctor if you experience increasing drainage, redness, or pain from a pin site, or if you notice purulent (thick, snot-like) drainage. As we discussed pin tract infections are common in this is most likely as a result of mechanical irritation from the skin pin interface. Primary treatment is hygiene and cleaning with soap and water. If this does not resolve with regular cleaning contact the office  Discharge Wound Care Instructions  Do NOT apply any  ointments, solutions or lotions to pin sites or surgical wounds.  These prevent needed drainage and even though solutions like hydrogen peroxide kill bacteria, they also damage cells lining the pin sites that help fight infection.  Applying lotions or ointments can keep the wounds moist and can cause them to breakdown and open up as well. This can increase the risk for infection. When in doubt call the office.  Surgical incisions should be dressed daily.  If any drainage is noted, use one layer of adaptic, then gauze, Kerlix, and an ace wrap.  Once the incision is completely dry and without drainage, it may be left open to air out.  Showering may begin 36-48 hours later.  Cleaning gently with soap and water.  Traumatic wounds should be dressed daily as well.    One layer of adaptic, gauze, Kerlix, then ace wrap.  The adaptic can be discontinued once the draining has ceased    If you have a wet to dry dressing: wet the gauze with saline the squeeze as much saline out so the gauze is moist (not soaking wet), place moistened gauze over wound, then place a dry gauze over the moist one, followed by Kerlix wrap, then ace wrap.   DVT/PE prophylaxis: xarelto 10 mg by mouth daily   Diet: as you were eating previously.  Can use over the counter stool softeners and bowel preparations, such as Miralax, to help with bowel movements.  Narcotics can be constipating.  Be sure to drink plenty of fluids  PAIN MEDICATION USE AND EXPECTATIONS  You have likely been given narcotic medications to help control your pain.  After a traumatic event that results in an fracture (broken bone) with or without surgery, it is ok to use narcotic pain medications to help control one's pain.  We understand that everyone responds to pain differently and each individual patient will be evaluated on a regular basis for the continued need for narcotic medications. Ideally, narcotic medication use should last no more than 6-8 weeks  (coinciding with fracture healing).   As  a patient it is your responsibility as well to monitor narcotic medication use and report the amount and frequency you use these medications when you come to your office visit.   We would also advise that if you are using narcotic medications, you should take a dose prior to therapy to maximize you participation.  IF YOU ARE ON NARCOTIC MEDICATIONS IT IS NOT PERMISSIBLE TO OPERATE A MOTOR VEHICLE (MOTORCYCLE/CAR/TRUCK/MOPED) OR HEAVY MACHINERY DO NOT MIX NARCOTICS WITH OTHER CNS (CENTRAL NERVOUS SYSTEM) DEPRESSANTS SUCH AS ALCOHOL   STOP SMOKING OR USING NICOTINE PRODUCTS!!!!  As discussed nicotine severely impairs your body's ability to heal surgical and traumatic wounds but also impairs bone healing.  Wounds and bone heal by forming microscopic blood vessels (angiogenesis) and nicotine is a vasoconstrictor (essentially, shrinks blood vessels).  Therefore, if vasoconstriction occurs to these microscopic blood vessels they essentially disappear and are unable to deliver necessary nutrients to the healing tissue.  This is one modifiable factor that you can do to dramatically increase your chances of healing your injury.    (This means no smoking, no nicotine gum, patches, etc)  DO NOT USE NONSTEROIDAL ANTI-INFLAMMATORY DRUGS (NSAID'S)  Using products such as Advil (ibuprofen), Aleve (naproxen), Motrin (ibuprofen) for additional pain control during fracture healing can delay and/or prevent the healing response.  If you would like to take over the counter (OTC) medication, Tylenol (acetaminophen) is ok.  However, some narcotic medications that are given for pain control contain acetaminophen as well. Therefore, you should not exceed more than 4000 mg of tylenol in a day if you do not have liver disease.  Also note that there are may OTC medicines, such as cold medicines and allergy medicines that my contain tylenol as well.  If you have any questions about  medications and/or interactions please ask your doctor/PA or your pharmacist.      ICE AND ELEVATE INJURED/OPERATIVE EXTREMITY  Using ice and elevating the injured extremity above your heart can help with swelling and pain control.  Icing in a pulsatile fashion, such as 20 minutes on and 20 minutes off, can be followed.    Do not place ice directly on skin. Make sure there is a barrier between to skin and the ice pack.    Using frozen items such as frozen peas works well as the conform nicely to the are that needs to be iced.  USE AN ACE WRAP OR TED HOSE FOR SWELLING CONTROL  In addition to icing and elevation, Ace wraps or TED hose are used to help limit and resolve swelling.  It is recommended to use Ace wraps or TED hose until you are informed to stop.    When using Ace Wraps start the wrapping distally (farthest away from the body) and wrap proximally (closer to the body)   Example: If you had surgery on your leg or thing and you do not have a splint on, start the ace wrap at the toes and work your way up to the thigh        If you had surgery on your upper extremity and do not have a splint on, start the ace wrap at your fingers and work your way up to the upper arm  IF YOU ARE IN A SPLINT OR CAST DO NOT REMOVE IT FOR ANY REASON   If your splint gets wet for any reason please contact the office immediately. You may shower in your splint or cast as long as you keep it dry.  This can be done by wrapping in a cast cover or garbage back (or similar)  Do Not stick any thing down your splint or cast such as pencils, money, or hangers to try and scratch yourself with.  If you feel itchy take benadryl as prescribed on the bottle for itching  IF YOU ARE IN A CAM BOOT (BLACK BOOT)  You may remove boot periodically. Perform daily dressing changes as noted below.  Wash the liner of the boot regularly and wear a sock when wearing the boot. It is recommended that you sleep in the boot until told  otherwise    Call office for the following: Temperature greater than 101F Persistent nausea and vomiting Severe uncontrolled pain Redness, tenderness, or signs of infection (pain, swelling, redness, odor or green/yellow discharge around the site) Difficulty breathing, headache or visual disturbances Hives Persistent dizziness or light-headedness Extreme fatigue Any other questions or concerns you may have after discharge  In an emergency, call 911 or go to an Emergency Department at a nearby hospital  HELPFUL INFORMATION  If you had a block, it will wear off between 8-24 hrs postop typically.  This is period when your pain may go from nearly zero to the pain you would have had postop without the block.  This is an abrupt transition but nothing dangerous is happening.  You may take an extra dose of narcotic when this happens.  You should wean off your narcotic medicines as soon as you are able.  Most patients will be off or using minimal narcotics before their first postop appointment.   We suggest you use the pain medication the first night prior to going to bed, in order to ease any pain when the anesthesia wears off. You should avoid taking pain medications on an empty stomach as it will make you nauseous.  Do not drink alcoholic beverages or take illicit drugs when taking pain medications.  In most states it is against the law to drive while you are in a splint or sling.  And certainly against the law to drive while taking narcotics.  You may return to work/school in the next couple of days when you feel up to it.   Pain medication may make you constipated.  Below are a few solutions to try in this order: Decrease the amount of pain medication if you aren't having pain. Drink lots of decaffeinated fluids. Drink prune juice and/or each dried prunes  If the first 3 don't work start with additional solutions Take Colace - an over-the-counter stool softener Take Senokot - an  over-the-counter laxative Take Miralax - a stronger over-the-counter laxative     CALL THE OFFICE WITH ANY QUESTIONS OR CONCERNS: [phone]   VISIT OUR WEBSITE FOR ADDITIONAL INFORMATION: [email or web address]   Driving restrictions   Complete by: As directed    No driving   Increase activity slowly as tolerated   Complete by: As directed    Increase activity slowly as tolerated   Complete by: As directed    Non weight bearing   Complete by: As directed    Laterality: left   Extremity: Lower      DISCHARGE MEDICATIONS:   Allergies as of 01/02/2021      Reactions   Penicillins Nausea And Vomiting, Rash   CEPHALOSPORIN TOLERANT 12/31/2020   Pregabalin    Other reaction(s): Altered mental status, Hallucinations   Codeine    Other reaction(s): nausea/vomitting      Medication List    STOP taking  these medications   ibuprofen 800 MG tablet Commonly known as: ADVIL     TAKE these medications   acetaminophen 325 MG tablet Commonly known as: TYLENOL Take 2 tablets (650 mg total) by mouth every 12 (twelve) hours.   amphetamine-dextroamphetamine 5 MG tablet Commonly known as: ADDERALL Take 5 mg by mouth daily.   diclofenac Sodium 1 % Gel Commonly known as: VOLTAREN Apply 1 application topically daily as needed for pain.   docusate sodium 100 MG capsule Commonly known as: COLACE Take 1 capsule (100 mg total) by mouth 2 (two) times daily.   gabapentin 300 MG capsule Commonly known as: NEURONTIN Take 1 capsule (300 mg total) by mouth 2 (two) times daily.   methocarbamol 500 MG tablet Commonly known as: ROBAXIN Take 1-2 tablets (500-1,000 mg total) by mouth every 8 (eight) hours as needed for muscle spasms.   Natural Vitamin D-3 125 MCG (5000 UT) Tabs Generic drug: Cholecalciferol Take by mouth daily.   oxyCODONE-acetaminophen 7.5-325 MG tablet Commonly known as: PERCOCET Take 1-2 tablets by mouth every 8 (eight) hours as needed for moderate pain or severe  pain.   vitamin C 1000 MG tablet Take 1 tablet (1,000 mg total) by mouth daily.   Xarelto 10 MG Tabs tablet Generic drug: rivaroxaban Take 1 tablet (10 mg total) by mouth daily.            Durable Medical Equipment  (From admission, onward)         Start     Ordered   01/01/21 1348  For home use only DME Walker rolling  Once       Comments: Left leg tibial fracture  Question Answer Comment  Walker: With 5 Inch Wheels   Patient needs a walker to treat with the following condition Right tibial fracture      01/01/21 1349   01/01/21 1332  For home use only DME 3 n 1  Once        01/01/21 1331   01/01/21 1331  For home use only DME Tub bench  Once       Comments: Transfer tub bench   01/01/21 1331           Discharge Care Instructions  (From admission, onward)         Start     Ordered   01/01/21 0000  Non weight bearing       Question Answer Comment  Laterality left   Extremity Lower      01/01/21 1225          FOLLOW UP VISIT:    Follow-up Information    Myrene Galas, MD. Schedule an appointment as soon as possible for a visit on 01/13/2021.   Specialty: Orthopedic Surgery Contact information: [address] [place] [address] [phone]               DISPOSITION: HOME VS. SNF  Dental Antibiotics:  In most cases prophylactic antibiotics for Dental procdeures after total joint surgery are not necessary.  Exceptions are as follows:  1. History of prior total joint infection  2. Severely immunocompromised (Organ Transplant, cancer chemotherapy, Rheumatoid biologic meds such as Humera)  3. Poorly controlled diabetes (A1C &gt; 8.0, blood glucose over 200)  If you have one of these conditions, contact your surgeon for an antibiotic prescription, prior to your dental procedure.   CONDITION:  Good   Guy Sandifer 01/02/2021, 7:46 AM

## 2021-01-02 NOTE — Progress Notes (Signed)
Physical Therapy Treatment Patient Details Name: Darlene Casey MRN: [id number] DOB: 1981/08/30 Today's Date: 01/02/2021    History of Present Illness Pt adm 4/7 with complex lt pilon fx of tibia and fibula after a fall down a ravine on 4/3 while hiking. Pt underwent placement of external fixator on 4/7. Pt also with rt ankle sprain. PMH - schizophrenia, htn, fibromyalgia.    PT Comments    Session focused on stair training prior to discharge home. Pt able to negotiate x 2 steps with left crutch and right railing and min assist provided by PT. Pt significant other present towards end of session and discussed guarding technique in addition to option of bumping up and down the steps with chair set up at the top for bridging the gap. Pt overall moving very well and demonstrates good adherence to weightbearing precautions. Main limitation will be pain control/management. Planned d/c this afternoon.     Follow Up Recommendations  No PT follow up     Equipment Recommendations  Rolling walker with 5" wheels;3in1 (PT);Wheelchair (measurements PT);Wheelchair cushion (measurements PT)    Recommendations for Other Services       Precautions / Restrictions Precautions Precautions: Fall;Other (comment) Precaution Comments: LLE ex fix Restrictions Weight Bearing Restrictions: Yes LLE Weight Bearing: Non weight bearing    Mobility  Bed Mobility Overal bed mobility: Independent             General bed mobility comments: OOB in recliner    Transfers Overall transfer level: Modified independent Equipment used: Rolling walker (2 wheeled) Transfers: Sit to/from Stand Sit to Stand: Supervision;Modified independent (Device/Increase time)         General transfer comment: Good technique  Ambulation/Gait Ambulation/Gait assistance: Supervision Gait Distance (Feet): 60 Feet Assistive device: Rolling walker (2 wheeled) Gait Pattern/deviations: Step-to pattern     General Gait  Details: Good adherence to weightbearing precautions, self cued for pacing, cues by PT for rolling walker rather than picking it up   Stairs Stairs: Yes Stairs assistance: Min assist Stair Management: One rail Right;With crutches Number of Stairs: 2 General stair comments: Pt negotiating x 2 steps with right railing and L crutch. Cues for sequencing, technique, assist for boost and LLE guarding   Wheelchair Mobility    Modified Rankin (Stroke Patients Only)       Balance Overall balance assessment: No apparent balance deficits (not formally assessed) Sitting-balance support: No upper extremity supported Sitting balance-Leahy Scale: Good     Standing balance support: Bilateral upper extremity supported Standing balance-Leahy Scale: Poor Standing balance comment: external support needed due to NWB LLE status                            Cognition Arousal/Alertness: Awake/alert Behavior During Therapy: WFL for tasks assessed/performed Overall Cognitive Status: Within Functional Limits for tasks assessed                                 General Comments: ot was cued for pacing as they attempt to make quick movments in session in standing position      Exercises      General Comments        Pertinent Vitals/Pain Pain Assessment: Faces Pain Score: 7  Faces Pain Scale: Hurts even more Pain Location: LLE Pain Descriptors / Indicators: Throbbing Pain Intervention(s): Monitored during session;Premedicated before session    Home Living  Prior Function            PT Goals (current goals can now be found in the care plan section) Acute Rehab PT Goals Patient Stated Goal: home Potential to Achieve Goals: Good Progress towards PT goals: Progressing toward goals    Frequency    Min 3X/week      PT Plan Current plan remains appropriate    Co-evaluation              AM-PAC PT "6 Clicks" Mobility    Outcome Measure  Help needed turning from your back to your side while in a flat bed without using bedrails?: None Help needed moving from lying on your back to sitting on the side of a flat bed without using bedrails?: None Help needed moving to and from a bed to a chair (including a wheelchair)?: None Help needed standing up from a chair using your arms (e.g., wheelchair or bedside chair)?: None Help needed to walk in hospital room?: A Little Help needed climbing 3-5 steps with a railing? : A Little 6 Click Score: 22    End of Session   Activity Tolerance: Patient tolerated treatment well Patient left: in chair;with call bell/phone within reach;with family/visitor present Nurse Communication: Mobility status PT Visit Diagnosis: Other abnormalities of gait and mobility (R26.89);Pain Pain - Right/Left: Left Pain - part of body: Leg     Time: 1126-1150 PT Time Calculation (min) (ACUTE ONLY): 24 min  Charges:  $Gait Training: 8-22 mins $Therapeutic Activity: 8-22 mins                     Lillia Pauls, PT, DPT Acute Rehabilitation Services Pager [phone] Office [phone]    Norval Morton 01/02/2021, 11:59 AM

## 2021-01-02 NOTE — Progress Notes (Signed)
SPORTS MEDICINE AND JOINT REPLACEMENT  Georgena Spurling, MD    Laurier Nancy, PA-C [address] [place], [place], [place]  [address]                             [phone]   PROGRESS NOTE  Subjective:  negative for Chest Pain  negative for Shortness of Breath  negative for Nausea/Vomiting   negative for Calf Pain  negative for Bowel Movement   Tolerating Diet: yes         Patient reports pain as 5 on 0-10 scale.    Objective: Vital signs in last 24 hours:   Patient Vitals for the past 24 hrs:  BP Temp Temp src Pulse Resp SpO2 Weight  01/02/21 0403 (!) 160/94 98.3 F (36.8 C) Oral 65 18 97 % 89 kg  01/01/21 1939 (!) 156/73 98.1 F (36.7 C) Oral 75 16 99 % --  01/01/21 1647 (!) 181/77 98.3 F (36.8 C) Oral 81 20 97 % --  01/01/21 1048 138/87 98.3 F (36.8 C) -- 71 18 99 % --    @flow {1959:LAST@   Intake/Output from previous day:   04/08 0701 - 04/09 0700 In: 2129.3 [P.O.:1580; I.V.:498.9] Out: -    Intake/Output this shift:   No intake/output data recorded.   Intake/Output      04/08 0701 04/09 0700 04/09 0701 04/10 0700   P.O. 1580    I.V. (mL/kg) 498.9 (5.6)    IV Piggyback 50.4    Total Intake(mL/kg) 2129.3 (23.9)    Urine (mL/kg/hr)     Blood     Total Output     Net +2129.3         Urine Occurrence 5 x    Stool Occurrence 0 x    Emesis Occurrence 0 x       LABORATORY DATA: Recent Labs    12/31/20 1341 01/01/21 0515 01/02/21 0553  WBC 8.7 12.3* 8.2  HGB 13.3 12.6 11.0*  HCT 39.5 36.6 31.9*  PLT 229 217 210   Recent Labs    12/31/20 1341 01/01/21 0515 01/02/21 0553  NA 136 136 138  K 3.9 4.0 3.3*  CL 103 104 105  CO2 27 22 26   BUN 11 11 10   CREATININE 0.73 0.66 0.66  GLUCOSE 105* 140* 135*  CALCIUM 9.2 8.9 8.3*   Lab Results  Component Value Date   INR 1.0 12/31/2020    Examination:  General appearance: alert, cooperative and no distress Extremities: extremities normal, atraumatic, no cyanosis or edema  Wound Exam: clean,  dry, intact   Drainage:  None: wound tissue dry  Motor Exam: Quadriceps and Hamstrings Intact  Sensory Exam: Superficial Peroneal, Deep Peroneal and Tibial normal   Assessment:    2 Days Post-Op  Procedure(s) (LRB): EXTERNAL FIXATION  ANKLE (Left)  ADDITIONAL DIAGNOSIS:  Principal Problem:   Closed left pilon fracture, initial encounter Active Problems:   Hepatitis C test positive   Hypertension   Schizophrenia (HCC)   GERD (gastroesophageal reflux disease)   Closed left fibular fracture   Vitamin D deficiency   Nicotine dependence   History of substance abuse (HCC)     Plan: Physical Therapy as ordered Non Weight Bearing (NWB)  DVT Prophylaxis:  Xarelto  DISCHARGE PLAN: Home  Patient is stable with some improvement in pain today. Plan is for D/C home wioth definitive fixation scheduled for 1-2 weeks  01/02/2021, 7:44 AM

## 2021-01-04 ENCOUNTER — Encounter (HOSPITAL_COMMUNITY): Payer: Self-pay | Admitting: Orthopedic Surgery

## 2021-01-31 NOTE — Op Note (Signed)
NAME: Darlene Casey MEDICAL RECORD QQ:[id number] DATE OF BIRTH:Oct 16, 1980 PHYSICIAN:Havannah Streat Rexene Edison Clenton Esper, MD  OPERATIVE REPORT  DATE OF PROCEDURE:  12/31/2020  PREOPERATIVE DIAGNOSES:   1.  Left ankle pilon fracture, tibia and fibula. 2.  Left ankle syndesmotic disruption.  POSTOPERATIVE DIAGNOSES: 1.  Left ankle pilon fracture, tibia and fibula. 2.  Left ankle syndesmotic disruption.  PROCEDURES: 1.  Closed reduction of left ankle pilon fracture, tibia and fibula. 2.  Application of multiplanar external fixator to lower extremity.  SURGEON:  Myrene Galas, MD  ASSISTANT:  Montez Morita, PA-C.  ANESTHESIA:  General.  COMPLICATIONS:  None.  TOURNIQUET:  None.  DRAINS:  None.  SPECIMENS:  None.  DISPOSITION:  To PACU.  CONDITION:  Stable.  INDICATIONS FOR PROCEDURE:  The patient is a 40 y.o. who sustained severe ankle injury in a fall down a ravine with subsequent pain, swelling, and inability to bear weight. X-rays showed comminution with shortening, angulation and malalignment. Because of the magnitude and severity of this articular fracture as well as associated risk of complications he local surgeon asserted this was outside his scope of practice and these injuries will be best managed by fellowship-trained orthopedic traumatologist.  Consequently, I was consulted, have evaluated the patient, and recommended spanning external fixation with possible compartment releases. We discussed a skin breakdown that could result in deep infection could be limb threatening nerve injury, vessel injury, DVT, PE,  arthritis, loss of motion.  The pin tract infection, anesthetic complications and multiple others and he would require a staged definitive internal fixation.  The patient and his family acknowledged these risks and consent was provided to proceed.  SUMMARY OF PROCEDURE:  The patient was given preoperative antibiotics, taken to the operating room where general anesthesia was induced.  The  operative lower extremity was prepped and draped in the usual sterile fashion beginning with chlorhexidine wash, then  Betadine scrub and paint.  Soft tissue swelling was considerable.  There were some fracture blisters present, as well.  A C-arm was brought in after making 2 small stab incisions and placing the tibial Schanz pins in the shaft.  These  were checked on AP and lateral images for position and length.  The clamp was then secured here.  C-arm was brought distally where a lateral was performed of the calcaneus and a transcalcaneal pin placed through a small stab incision from medial to  lateral parallel with the talar dome on the AP. I placed a bump underneath the fracture site and pulled longitudinal traction, derotated and translated the talus directly underneath the tibia and while holding the reduction, my  assistant secured the clamps and bars in this plane.  An assistant was necessary for this.  We then placed 2 pins in the first and fifth metatarsal shafts.  I brought the foot up into a plantigrade position and held it here while my assistant again secured the bars and pins in this separate plane, oriented at 90 degrees from the first in order to secure proper position of the talus and foot to the ankle and tibia. Final images were obtained consisting of AP, mortise and lateral projections that showed restoration of length and alignment with again acceptable pin placement.  Also demonstrated disruption of the syndesmosis with excessive widening there.  Mepitel was placed over the fracture blisters then gauze and a gently compressive dressing from foot to knee.  The patient was awakened from anesthesia and transported to PACU in stable condition.   PROGNOSIS:  Formal pharmacologic  DVT prophylaxis will begin with Xarelto.  We will continue to follow compartment swelling clinically. NWB with PT/ OT. Proceed with definitive internal fixation when soft tissue swelling allows. Local wound care with  nonstick dressing and Kerlix for the pins with compression from toes to knee. Elevated risks of complications including infection, DVT/PE, nonunion, and arthritis among others certainly persists.

## 2021-02-10 ENCOUNTER — Encounter (HOSPITAL_COMMUNITY): Payer: Self-pay | Admitting: Orthopedic Surgery

## 2021-02-10 NOTE — Progress Notes (Signed)
Unable to reach patient via phone.  Mailbox is full, unable to leave message on machine with instructions for DOS. Called her Mother Blenda Nicely [phone].  Gave her detailed instructions for day of surgery for patient.  PCP - Dr Donnel Saxon Cardiologist - n/a  Chest x-ray - n/a EKG - n/a Stress Test - unknown ECHO - unknown Cardiac Cath - n/a  Blood Thinner Instructions:  Follow your surgeon's instructions on when to stop Xarelto prior to surgery.   ERAS: Clear liquids til 10 am on DOS  STOP now taking any Aspirin (unless otherwise instructed by your surgeon), Aleve, Naproxen, Ibuprofen, Motrin, Advil, Goody's, BC's, all herbal medications, fish oil, and all vitamins.   Coronavirus Screening Covid test on DOS 02/11/21.

## 2021-02-11 ENCOUNTER — Encounter (HOSPITAL_COMMUNITY)
Admission: RE | Disposition: A | Discharge status: Home / Self Care | Payer: Self-pay | Source: Ambulatory Visit | Attending: Orthopedic Surgery

## 2021-02-11 ENCOUNTER — Observation Stay (HOSPITAL_COMMUNITY)
Admission: RE | Admit: 2021-02-11 | Discharge: 2021-02-12 | Disposition: A | Discharge status: Home / Self Care | Payer: Medicare HMO | Source: Ambulatory Visit | Attending: Orthopedic Surgery | Admitting: Orthopedic Surgery

## 2021-02-11 ENCOUNTER — Ambulatory Visit (HOSPITAL_COMMUNITY): Payer: Medicare HMO | Admitting: Anesthesiology

## 2021-02-11 ENCOUNTER — Other Ambulatory Visit: Payer: Self-pay

## 2021-02-11 ENCOUNTER — Observation Stay (HOSPITAL_COMMUNITY): Payer: Medicare HMO

## 2021-02-11 ENCOUNTER — Encounter (HOSPITAL_COMMUNITY): Payer: Self-pay | Admitting: Orthopedic Surgery

## 2021-02-11 ENCOUNTER — Ambulatory Visit (HOSPITAL_COMMUNITY): Payer: Medicare HMO

## 2021-02-11 DIAGNOSIS — F209 Schizophrenia, unspecified: Secondary | ICD-10-CM | POA: Insufficient documentation

## 2021-02-11 DIAGNOSIS — R2689 Other abnormalities of gait and mobility: Secondary | ICD-10-CM | POA: Insufficient documentation

## 2021-02-11 DIAGNOSIS — K219 Gastro-esophageal reflux disease without esophagitis: Secondary | ICD-10-CM | POA: Diagnosis present

## 2021-02-11 DIAGNOSIS — E559 Vitamin D deficiency, unspecified: Secondary | ICD-10-CM | POA: Diagnosis present

## 2021-02-11 DIAGNOSIS — W19XXXA Unspecified fall, initial encounter: Secondary | ICD-10-CM | POA: Diagnosis not present

## 2021-02-11 DIAGNOSIS — Z8619 Personal history of other infectious and parasitic diseases: Secondary | ICD-10-CM | POA: Diagnosis not present

## 2021-02-11 DIAGNOSIS — Z79899 Other long term (current) drug therapy: Secondary | ICD-10-CM | POA: Diagnosis not present

## 2021-02-11 DIAGNOSIS — I10 Essential (primary) hypertension: Secondary | ICD-10-CM | POA: Diagnosis present

## 2021-02-11 DIAGNOSIS — Z880 Allergy status to penicillin: Secondary | ICD-10-CM | POA: Diagnosis not present

## 2021-02-11 DIAGNOSIS — Z885 Allergy status to narcotic agent status: Secondary | ICD-10-CM | POA: Diagnosis not present

## 2021-02-11 DIAGNOSIS — F32A Depression, unspecified: Secondary | ICD-10-CM | POA: Insufficient documentation

## 2021-02-11 DIAGNOSIS — F17210 Nicotine dependence, cigarettes, uncomplicated: Secondary | ICD-10-CM | POA: Diagnosis not present

## 2021-02-11 DIAGNOSIS — Z7901 Long term (current) use of anticoagulants: Secondary | ICD-10-CM | POA: Diagnosis not present

## 2021-02-11 DIAGNOSIS — F17200 Nicotine dependence, unspecified, uncomplicated: Secondary | ICD-10-CM | POA: Diagnosis present

## 2021-02-11 DIAGNOSIS — Z20822 Contact with and (suspected) exposure to covid-19: Secondary | ICD-10-CM | POA: Diagnosis not present

## 2021-02-11 DIAGNOSIS — F1911 Other psychoactive substance abuse, in remission: Secondary | ICD-10-CM | POA: Diagnosis present

## 2021-02-11 DIAGNOSIS — T148XXA Other injury of unspecified body region, initial encounter: Secondary | ICD-10-CM

## 2021-02-11 DIAGNOSIS — B1920 Unspecified viral hepatitis C without hepatic coma: Secondary | ICD-10-CM | POA: Diagnosis present

## 2021-02-11 DIAGNOSIS — Z87891 Personal history of nicotine dependence: Secondary | ICD-10-CM | POA: Diagnosis not present

## 2021-02-11 DIAGNOSIS — S82872A Displaced pilon fracture of left tibia, initial encounter for closed fracture: Secondary | ICD-10-CM | POA: Diagnosis present

## 2021-02-11 DIAGNOSIS — Z419 Encounter for procedure for purposes other than remedying health state, unspecified: Secondary | ICD-10-CM

## 2021-02-11 HISTORY — PX: EXTERNAL FIXATION REMOVAL: SHX5040

## 2021-02-11 HISTORY — PX: ORIF ANKLE FRACTURE: SHX5408

## 2021-02-11 HISTORY — DX: Depression, unspecified: F32.A

## 2021-02-11 HISTORY — DX: Inflammatory liver disease, unspecified: K75.9

## 2021-02-11 LAB — CBC WITH DIFFERENTIAL/PLATELET
Abs Immature Granulocytes: 0.03 10*3/uL (ref 0.00–0.07)
Basophils Absolute: 0.1 10*3/uL (ref 0.0–0.1)
Basophils Relative: 1 %
Eosinophils Absolute: 0.1 10*3/uL (ref 0.0–0.5)
Eosinophils Relative: 1 %
HCT: 43.7 % (ref 36.0–46.0)
Hemoglobin: 14.4 g/dL (ref 12.0–15.0)
Immature Granulocytes: 0 %
Lymphocytes Relative: 18 %
Lymphs Abs: 2.1 10*3/uL (ref 0.7–4.0)
MCH: 30.9 pg (ref 26.0–34.0)
MCHC: 33 g/dL (ref 30.0–36.0)
MCV: 93.8 fL (ref 80.0–100.0)
Monocytes Absolute: 0.6 10*3/uL (ref 0.1–1.0)
Monocytes Relative: 5 %
Neutro Abs: 8.9 10*3/uL — ABNORMAL HIGH (ref 1.7–7.7)
Neutrophils Relative %: 75 %
Platelets: 290 10*3/uL (ref 150–400)
RBC: 4.66 MIL/uL (ref 3.87–5.11)
RDW: 12.5 % (ref 11.5–15.5)
WBC: 11.7 10*3/uL — ABNORMAL HIGH (ref 4.0–10.5)
nRBC: 0 % (ref 0.0–0.2)

## 2021-02-11 LAB — COMPREHENSIVE METABOLIC PANEL
ALT: 21 U/L (ref 0–44)
AST: 21 U/L (ref 15–41)
Albumin: 4.3 g/dL (ref 3.5–5.0)
Alkaline Phosphatase: 58 U/L (ref 38–126)
Anion gap: 8 (ref 5–15)
BUN: 8 mg/dL (ref 6–20)
CO2: 27 mmol/L (ref 22–32)
Calcium: 9.6 mg/dL (ref 8.9–10.3)
Chloride: 104 mmol/L (ref 98–111)
Creatinine, Ser: 0.71 mg/dL (ref 0.44–1.00)
GFR, Estimated: >60 mL/min (ref >60)
Glucose, Bld: 88 mg/dL (ref 70–99)
Potassium: 3.4 mmol/L — ABNORMAL LOW (ref 3.5–5.1)
Sodium: 139 mmol/L (ref 135–145)
Total Bilirubin: 0.5 mg/dL (ref 0.3–1.2)
Total Protein: 7.2 g/dL (ref 6.5–8.1)

## 2021-02-11 LAB — CBC
MCHC: 33.6 g/dL (ref 30.0–36.0)
Platelets: 253 10*3/uL (ref 150–400)
nRBC: 0 % (ref 0.0–0.2)

## 2021-02-11 LAB — SURGICAL PCR SCREEN
MRSA, PCR: NEGATIVE
Staphylococcus aureus: NEGATIVE

## 2021-02-11 LAB — SARS CORONAVIRUS 2 BY RT PCR (HOSPITAL ORDER, PERFORMED IN ~~LOC~~ HOSPITAL LAB): SARS Coronavirus 2: NEGATIVE

## 2021-02-11 LAB — PROTIME-INR
INR: 1 (ref 0.8–1.2)
Prothrombin Time: 12.9 seconds (ref 11.4–15.2)

## 2021-02-11 LAB — POCT PREGNANCY, URINE: Preg Test, Ur: NEGATIVE

## 2021-02-11 SURGERY — OPEN REDUCTION INTERNAL FIXATION (ORIF) ANKLE FRACTURE
Anesthesia: Regional | Site: Leg Lower | Laterality: Left

## 2021-02-11 MED ORDER — ROPIVACAINE HCL 5 MG/ML IJ SOLN
INTRAMUSCULAR | Status: DC | PRN
  Administered 2021-02-11: 30 mL via PERINEURAL

## 2021-02-11 MED ORDER — EPHEDRINE SULFATE-NACL 50-0.9 MG/10ML-% IV SOSY
PREFILLED_SYRINGE | INTRAVENOUS | Status: DC | PRN
  Administered 2021-02-11: 5 mg via INTRAVENOUS

## 2021-02-11 MED ORDER — ORAL CARE MOUTH RINSE: 15.0000 mL | Freq: Once | OROMUCOSAL | Status: AC

## 2021-02-11 MED ORDER — FENTANYL CITRATE (PF) 100 MCG/2ML IJ SOLN
INTRAMUSCULAR | Status: AC
  Administered 2021-02-11: 100 ug via INTRAVENOUS
  Filled 2021-02-11: qty 2

## 2021-02-11 MED ORDER — LACTATED RINGERS IV SOLN: INTRAVENOUS | Status: DC

## 2021-02-11 MED ORDER — FENTANYL CITRATE (PF) 250 MCG/5ML IJ SOLN
INTRAMUSCULAR | Status: DC | PRN
  Administered 2021-02-11: 50 ug via INTRAVENOUS
  Administered 2021-02-11: 100 ug via INTRAVENOUS

## 2021-02-11 MED ORDER — ROCURONIUM BROMIDE 10 MG/ML (PF) SYRINGE
PREFILLED_SYRINGE | INTRAVENOUS | Status: DC | PRN
  Administered 2021-02-11: 50 mg via INTRAVENOUS
  Administered 2021-02-11: 10 mg via INTRAVENOUS

## 2021-02-11 MED ORDER — LIDOCAINE 2% (20 MG/ML) 5 ML SYRINGE
INTRAMUSCULAR | Status: DC | PRN
  Administered 2021-02-11: 60 mg via INTRAVENOUS

## 2021-02-11 MED ORDER — FENTANYL CITRATE (PF) 100 MCG/2ML IJ SOLN: 25.0000 ug | INTRAMUSCULAR | Status: DC | PRN

## 2021-02-11 MED ORDER — PROPOFOL 10 MG/ML IV BOLUS
INTRAVENOUS | Status: AC
  Filled 2021-02-11: qty 20

## 2021-02-11 MED ORDER — ACETAMINOPHEN 10 MG/ML IV SOLN: 1000.0000 mg | Freq: Once | INTRAVENOUS | Status: DC | PRN

## 2021-02-11 MED ORDER — METHOCARBAMOL 500 MG PO TABS
1000.0000 mg | ORAL_TABLET | Freq: Three times a day (TID) | ORAL | Status: DC
Start: 2021-02-11 — End: 2021-02-12
  Administered 2021-02-11 – 2021-02-12 (×2): 1000 mg via ORAL
  Filled 2021-02-11 (×2): qty 2

## 2021-02-11 MED ORDER — PROPOFOL 10 MG/ML IV BOLUS
INTRAVENOUS | Status: DC | PRN
  Administered 2021-02-11: 150 mg via INTRAVENOUS

## 2021-02-11 MED ORDER — POVIDONE-IODINE 10 % EX SWAB
2.0000 "application " | Freq: Once | CUTANEOUS | Status: AC
  Administered 2021-02-11: 2 via TOPICAL

## 2021-02-11 MED ORDER — PROMETHAZINE HCL 25 MG/ML IJ SOLN: 6.2500 mg | INTRAMUSCULAR | Status: DC | PRN

## 2021-02-11 MED ORDER — METHOCARBAMOL 1000 MG/10ML IJ SOLN
500.0000 mg | Freq: Three times a day (TID) | INTRAVENOUS | Status: DC
  Filled 2021-02-11: qty 5

## 2021-02-11 MED ORDER — MIDAZOLAM HCL 2 MG/2ML IJ SOLN
INTRAMUSCULAR | Status: AC
  Filled 2021-02-11: qty 2

## 2021-02-11 MED ORDER — DOCUSATE SODIUM 100 MG PO CAPS
100.0000 mg | ORAL_CAPSULE | Freq: Two times a day (BID) | ORAL | Status: DC
  Administered 2021-02-11 – 2021-02-12 (×2): 100 mg via ORAL
  Filled 2021-02-11 (×2): qty 1

## 2021-02-11 MED ORDER — ENOXAPARIN SODIUM 40 MG/0.4ML IJ SOSY
40.0000 mg | PREFILLED_SYRINGE | INTRAMUSCULAR | Status: DC
  Administered 2021-02-12: 40 mg via SUBCUTANEOUS
  Filled 2021-02-11: qty 0.4

## 2021-02-11 MED ORDER — METOCLOPRAMIDE HCL 5 MG PO TABS
5.0000 mg | ORAL_TABLET | Freq: Three times a day (TID) | ORAL | Status: DC | PRN
Start: 2021-02-11 — End: 2021-02-12

## 2021-02-11 MED ORDER — CHLORHEXIDINE GLUCONATE 0.12 % MT SOLN: 15.0000 mL | Freq: Once | OROMUCOSAL | Status: AC

## 2021-02-11 MED ORDER — ONDANSETRON HCL 4 MG PO TABS: 4.0000 mg | ORAL_TABLET | Freq: Four times a day (QID) | ORAL | Status: DC | PRN

## 2021-02-11 MED ORDER — CHLORHEXIDINE GLUCONATE 0.12 % MT SOLN
OROMUCOSAL | Status: AC
  Administered 2021-02-11: 15 mL via OROMUCOSAL
  Filled 2021-02-11: qty 15

## 2021-02-11 MED ORDER — DEXAMETHASONE SODIUM PHOSPHATE 10 MG/ML IJ SOLN
INTRAMUSCULAR | Status: DC | PRN
  Administered 2021-02-11: 10 mg

## 2021-02-11 MED ORDER — METOCLOPRAMIDE HCL 5 MG/ML IJ SOLN: 5.0000 mg | Freq: Three times a day (TID) | INTRAMUSCULAR | Status: DC | PRN

## 2021-02-11 MED ORDER — PROPOFOL 1000 MG/100ML IV EMUL
INTRAVENOUS | Status: AC
  Filled 2021-02-11: qty 100

## 2021-02-11 MED ORDER — MIDAZOLAM HCL 2 MG/2ML IJ SOLN
INTRAMUSCULAR | Status: AC
  Administered 2021-02-11: 2 mg via INTRAVENOUS
  Filled 2021-02-11: qty 2

## 2021-02-11 MED ORDER — ONDANSETRON HCL 4 MG/2ML IJ SOLN
INTRAMUSCULAR | Status: DC | PRN
  Administered 2021-02-11: 4 mg via INTRAVENOUS

## 2021-02-11 MED ORDER — FENTANYL CITRATE (PF) 250 MCG/5ML IJ SOLN
INTRAMUSCULAR | Status: AC
  Filled 2021-02-11: qty 5

## 2021-02-11 MED ORDER — SUGAMMADEX SODIUM 200 MG/2ML IV SOLN
INTRAVENOUS | Status: DC | PRN
  Administered 2021-02-11: 200 mg via INTRAVENOUS

## 2021-02-11 MED ORDER — MIDAZOLAM HCL 2 MG/2ML IJ SOLN: 2.0000 mg | Freq: Once | INTRAMUSCULAR | Status: AC

## 2021-02-11 MED ORDER — MIDAZOLAM HCL 5 MG/5ML IJ SOLN
INTRAMUSCULAR | Status: DC | PRN
  Administered 2021-02-11: 2 mg via INTRAVENOUS

## 2021-02-11 MED ORDER — DIPHENHYDRAMINE HCL 50 MG/ML IJ SOLN
INTRAMUSCULAR | Status: DC | PRN
  Administered 2021-02-11: 12.5 mg via INTRAVENOUS

## 2021-02-11 MED ORDER — ONDANSETRON HCL 4 MG/2ML IJ SOLN: 4.0000 mg | Freq: Four times a day (QID) | INTRAMUSCULAR | Status: DC | PRN

## 2021-02-11 MED ORDER — GABAPENTIN 300 MG PO CAPS
300.0000 mg | ORAL_CAPSULE | Freq: Two times a day (BID) | ORAL | Status: DC
  Administered 2021-02-11 – 2021-02-12 (×2): 300 mg via ORAL
  Filled 2021-02-11 (×2): qty 1

## 2021-02-11 MED ORDER — OXYCODONE-ACETAMINOPHEN 7.5-325 MG PO TABS
1.0000 | ORAL_TABLET | Freq: Three times a day (TID) | ORAL | Status: DC | PRN
Start: 2021-02-11 — End: 2021-02-12
  Administered 2021-02-11: 1 via ORAL
  Administered 2021-02-12: 2 via ORAL
  Administered 2021-02-12: 1 via ORAL
  Filled 2021-02-11: qty 2
  Filled 2021-02-11: qty 1
  Filled 2021-02-11: qty 2

## 2021-02-11 MED ORDER — CEFAZOLIN SODIUM-DEXTROSE 1-4 GM/50ML-% IV SOLN
1.0000 g | Freq: Four times a day (QID) | INTRAVENOUS | Status: AC
Start: 2021-02-11 — End: 2021-02-12
  Administered 2021-02-11 – 2021-02-12 (×3): 1 g via INTRAVENOUS
  Filled 2021-02-11 (×3): qty 50

## 2021-02-11 MED ORDER — AMPHETAMINE-DEXTROAMPHETAMINE 5 MG PO TABS
5.0000 mg | ORAL_TABLET | Freq: Every day | ORAL | Status: DC
  Administered 2021-02-12: 5 mg via ORAL
  Filled 2021-02-11: qty 1

## 2021-02-11 MED ORDER — CEFAZOLIN SODIUM-DEXTROSE 2-4 GM/100ML-% IV SOLN
INTRAVENOUS | Status: AC
  Filled 2021-02-11: qty 100

## 2021-02-11 MED ORDER — POTASSIUM CHLORIDE IN NACL 20-0.9 MEQ/L-% IV SOLN
INTRAVENOUS | Status: DC
  Filled 2021-02-11: qty 1000

## 2021-02-11 MED ORDER — FENTANYL CITRATE (PF) 100 MCG/2ML IJ SOLN: 100.0000 ug | Freq: Once | INTRAMUSCULAR | Status: AC

## 2021-02-11 MED ORDER — CHLORHEXIDINE GLUCONATE 4 % EX LIQD: 60.0000 mL | Freq: Once | CUTANEOUS | Status: DC

## 2021-02-11 MED ORDER — DEXAMETHASONE SODIUM PHOSPHATE 10 MG/ML IJ SOLN
INTRAMUSCULAR | Status: DC | PRN
  Administered 2021-02-11: 10 mg via INTRAVENOUS

## 2021-02-11 MED ORDER — CEFAZOLIN SODIUM-DEXTROSE 2-4 GM/100ML-% IV SOLN
2.0000 g | INTRAVENOUS | Status: AC
  Administered 2021-02-11: 2 g via INTRAVENOUS

## 2021-02-11 MED ORDER — 0.9 % SODIUM CHLORIDE (POUR BTL) OPTIME
TOPICAL | Status: DC | PRN
  Administered 2021-02-11: 1000 mL

## 2021-02-11 SURGICAL SUPPLY — 88 items
BANDAGE ESMARK 6X9 LF (GAUZE/BANDAGES/DRESSINGS) ×2 IMPLANT
BIT DRILL 2.5X2.75 QC CALB (BIT) ×1 IMPLANT
BIT DRILL CALIBRATED 2.7 (BIT) ×1 IMPLANT
BNDG CMPR 9X6 STRL LF SNTH (GAUZE/BANDAGES/DRESSINGS) ×2
BNDG CMPR MED 10X6 ELC LF (GAUZE/BANDAGES/DRESSINGS) ×2
BNDG CMPR STD VLCR NS LF 5.8X4 (GAUZE/BANDAGES/DRESSINGS) ×2
BNDG ELASTIC 4X5.8 VLCR NS LF (GAUZE/BANDAGES/DRESSINGS) ×1 IMPLANT
BNDG ELASTIC 4X5.8 VLCR STR LF (GAUZE/BANDAGES/DRESSINGS) ×3 IMPLANT
BNDG ELASTIC 6X10 VLCR STRL LF (GAUZE/BANDAGES/DRESSINGS) ×1 IMPLANT
BNDG ELASTIC 6X5.8 VLCR STR LF (GAUZE/BANDAGES/DRESSINGS) ×3 IMPLANT
BNDG ESMARK 6X9 LF (GAUZE/BANDAGES/DRESSINGS) ×3
BNDG GAUZE ELAST 4 BULKY (GAUZE/BANDAGES/DRESSINGS) ×5 IMPLANT
BONE CANC CHIPS 40CC CAN1/2 (Bone Implant) ×3 IMPLANT
BRUSH SCRUB EZ PLAIN DRY (MISCELLANEOUS) ×6 IMPLANT
CHIPS CANC BONE 40CC CAN1/2 (Bone Implant) ×2 IMPLANT
COVER MAYO STAND STRL (DRAPES) ×3 IMPLANT
COVER SURGICAL LIGHT HANDLE (MISCELLANEOUS) ×6 IMPLANT
COVER WAND RF STERILE (DRAPES) ×3 IMPLANT
CUFF TOURN SGL QUICK 34 (TOURNIQUET CUFF) ×3
CUFF TRNQT CYL 34X4.125X (TOURNIQUET CUFF) ×2 IMPLANT
DRAPE C-ARM 42X72 X-RAY (DRAPES) ×3 IMPLANT
DRAPE C-ARMOR (DRAPES) ×3 IMPLANT
DRAPE HALF SHEET 40X57 (DRAPES) ×3 IMPLANT
DRAPE U-SHAPE 47X51 STRL (DRAPES) ×3 IMPLANT
DRSG ADAPTIC 3X8 NADH LF (GAUZE/BANDAGES/DRESSINGS) ×3 IMPLANT
DRSG EMULSION OIL 3X3 NADH (GAUZE/BANDAGES/DRESSINGS) IMPLANT
ELECT REM PT RETURN 9FT ADLT (ELECTROSURGICAL) ×3
ELECTRODE REM PT RTRN 9FT ADLT (ELECTROSURGICAL) ×2 IMPLANT
GAUZE SPONGE 4X4 12PLY STRL (GAUZE/BANDAGES/DRESSINGS) ×5 IMPLANT
GAUZE SPONGE 4X4 16PLY XRAY LF (GAUZE/BANDAGES/DRESSINGS) ×1 IMPLANT
GLOVE BIO SURGEON STRL SZ7.5 (GLOVE) ×3 IMPLANT
GLOVE BIO SURGEON STRL SZ8 (GLOVE) ×3 IMPLANT
GLOVE BIOGEL PI IND STRL 7.5 (GLOVE) ×2 IMPLANT
GLOVE BIOGEL PI INDICATOR 7.5 (GLOVE) ×1
GLOVE SRG 8 PF TXTR STRL LF DI (GLOVE) ×2 IMPLANT
GLOVE SURG UNDER POLY LF SZ8 (GLOVE) ×3
GOWN STRL REUS W/ TWL LRG LVL3 (GOWN DISPOSABLE) ×4 IMPLANT
GOWN STRL REUS W/ TWL XL LVL3 (GOWN DISPOSABLE) ×2 IMPLANT
GOWN STRL REUS W/TWL LRG LVL3 (GOWN DISPOSABLE) ×6
GOWN STRL REUS W/TWL XL LVL3 (GOWN DISPOSABLE) ×3
GRAFT BNE CHIP CANC 1-8 40 (Bone Implant) IMPLANT
K-WIRE ACE 1.6X6 (WIRE) ×6
KIT BASIN OR (CUSTOM PROCEDURE TRAY) ×3 IMPLANT
KIT TURNOVER KIT B (KITS) ×3 IMPLANT
KWIRE ACE 1.6X6 (WIRE) IMPLANT
MANIFOLD NEPTUNE II (INSTRUMENTS) ×3 IMPLANT
NDL HYPO 21X1.5 SAFETY (NEEDLE) IMPLANT
NEEDLE HYPO 21X1.5 SAFETY (NEEDLE) IMPLANT
NS IRRIG 1000ML POUR BTL (IV SOLUTION) ×3 IMPLANT
PACK GENERAL/GYN (CUSTOM PROCEDURE TRAY) ×3 IMPLANT
PACK ORTHO EXTREMITY (CUSTOM PROCEDURE TRAY) ×3 IMPLANT
PAD ABD 8X10 STRL (GAUZE/BANDAGES/DRESSINGS) ×1 IMPLANT
PAD ARMBOARD 7.5X6 YLW CONV (MISCELLANEOUS) ×6 IMPLANT
PAD CAST 3X4 CTTN HI CHSV (CAST SUPPLIES) IMPLANT
PAD CAST 4YDX4 CTTN HI CHSV (CAST SUPPLIES) IMPLANT
PADDING CAST COTTON 3X4 STRL (CAST SUPPLIES) ×3
PADDING CAST COTTON 4X4 STRL (CAST SUPPLIES)
PADDING CAST COTTON 6X4 STRL (CAST SUPPLIES) ×9 IMPLANT
PLATE 9H LT DIST ANTLAT TIB (Plate) ×3 IMPLANT
PLATE ANTLAT CNTR 156X9 (Plate) IMPLANT
SCREW CORTICAL 3.5MM  28MM (Screw) ×3 IMPLANT
SCREW CORTICAL 3.5MM  34MM (Screw) ×3 IMPLANT
SCREW CORTICAL 3.5MM 28MM (Screw) IMPLANT
SCREW CORTICAL 3.5MM 34MM (Screw) IMPLANT
SCREW CORTICAL 3.5MM 40MM (Screw) ×1 IMPLANT
SCREW LOCK CORT STAR 3.5X34 (Screw) ×1 IMPLANT
SCREW LOCK CORT STAR 3.5X40 (Screw) ×1 IMPLANT
SCREW LOCK CORT STAR 3.5X42 (Screw) ×1 IMPLANT
SCREW LOCK CORT STAR 3.5X44 (Screw) ×1 IMPLANT
SCREW LP NL T15 3.5X24 (Screw) ×2 IMPLANT
SCREW LP NL T15 3.5X26 (Screw) ×1 IMPLANT
SCREW T15 LP CORT 3.5X36MM NS (Screw) ×1 IMPLANT
SCREW T15 LP CORT 3.5X42MM NS (Screw) ×1 IMPLANT
SPLINT PLASTER CAST XFAST 5X30 (CAST SUPPLIES) IMPLANT
SPLINT PLASTER XFAST SET 5X30 (CAST SUPPLIES) ×1
SPONGE LAP 18X18 RF (DISPOSABLE) ×3 IMPLANT
STAPLER VISISTAT 35W (STAPLE) IMPLANT
SUCTION FRAZIER HANDLE 10FR (MISCELLANEOUS) ×3
SUCTION TUBE FRAZIER 10FR DISP (MISCELLANEOUS) ×2 IMPLANT
SUT ETHILON 3 0 PS 1 (SUTURE) ×6 IMPLANT
SUT PDS AB 2-0 CT1 27 (SUTURE) IMPLANT
SUT VIC AB 2-0 CT1 27 (SUTURE) ×6
SUT VIC AB 2-0 CT1 TAPERPNT 27 (SUTURE) ×4 IMPLANT
TOWEL GREEN STERILE (TOWEL DISPOSABLE) ×6 IMPLANT
TOWEL GREEN STERILE FF (TOWEL DISPOSABLE) ×6 IMPLANT
TUBE CONNECTING 12X1/4 (SUCTIONS) ×3 IMPLANT
UNDERPAD 30X36 HEAVY ABSORB (UNDERPADS AND DIAPERS) ×3 IMPLANT
WATER STERILE IRR 1000ML POUR (IV SOLUTION) ×6 IMPLANT

## 2021-02-11 NOTE — H&P (Signed)
[facility] H&P/Consult     Patient ID: Darlene Casey MRN: [id number] DOB/AGE: 02-Dec-1980 40 y.o.  Chief Complaint: Left tibial pilon fracture HPI: Darlene Casey is an 40 y.o. female.s/p external fixation who returns for definitive repair.  Past Medical History:  Diagnosis Date  . Closed left fibular fracture 01/01/2021  . Fibromyalgia   . GERD (gastroesophageal reflux disease)   . Hepatitis    Hep C test positive several yrs ago but has been tested negative since then per patient  . Hepatitis C test positive    2-3 years ago. Pt has had a negative test since then.  . History of substance abuse (HCC) 01/01/2021  . Hypertension   . Nicotine dependence 01/01/2021  . Schizophrenia (HCC)    has ECT treatment  . Vitamin D deficiency 01/01/2021    Past Surgical History:  Procedure Laterality Date  . BREAST SURGERY     breast augmentation  . CHOLECYSTECTOMY    . EUSTACHIAN TUBE DILATION    . EXTERNAL FIXATION LEG Left 12/31/2020   Procedure: EXTERNAL FIXATION  ANKLE;  Surgeon: Myrene Galas, MD;  Location: [facility] OR;  Service: Orthopedics;  Laterality: Left;  . TONSILLECTOMY    . TUBAL LIGATION      No family history on file. Social History:  reports that she has been smoking. She has been smoking about 0.50 packs per day. She has never used smokeless tobacco. She reports previous alcohol use. She reports current drug use. Drug: Marijuana.  Allergies:  Allergies  Allergen Reactions  . Penicillins Nausea And Vomiting and Rash    CEPHALOSPORIN TOLERANT 12/31/20 Reaction: Childhood & 2005  . Pregabalin     Other reaction(s): Altered mental status, Hallucinations  . Codeine     Other reaction(s): nausea/vomitting    Medications Prior to Admission  Medication Sig Dispense Refill  . acetaminophen (TYLENOL) 325 MG tablet Take 2 tablets (650 mg total) by mouth every 12 (twelve) hours. 60 tablet 0  . amphetamine-dextroamphetamine (ADDERALL) 5 MG  tablet Take 5 mg by mouth daily.    Marland Kitchen ascorbic acid (VITAMIN C) 1000 MG tablet Take 1 tablet (1,000 mg total) by mouth daily. 30 tablet 1  . Cholecalciferol 125 MCG (5000 UT) TABS Take by mouth daily. (Patient taking differently: Take 5,000 Units by mouth daily.) 30 tablet 5  . diclofenac Sodium (VOLTAREN) 1 % GEL Apply 1 application topically daily as needed for pain.    Marland Kitchen docusate sodium (COLACE) 100 MG capsule Take 1 capsule (100 mg total) by mouth 2 (two) times daily. (Patient taking differently: Take 100 mg by mouth daily.) 30 capsule 0  . gabapentin (NEURONTIN) 300 MG capsule Take 1 capsule (300 mg total) by mouth 2 (two) times daily. (Patient taking differently: Take 300-600 mg by mouth every 8 (eight) hours.) 60 capsule 0  . ibuprofen (ADVIL) 800 MG tablet Take 800 mg by mouth every 8 (eight) hours as needed for moderate pain or mild pain.    . methocarbamol (ROBAXIN) 500 MG tablet Take 1-2 tablets (500-1,000 mg total) by mouth every 8 (eight) hours as needed for muscle spasms. 60 tablet 0  . oxyCODONE-acetaminophen (PERCOCET) 7.5-325 MG tablet Take 1-2 tablets by mouth every 8 (eight) hours as needed for moderate pain or severe pain. 42 tablet 0  . rivaroxaban (XARELTO) 10 MG TABS tablet Take 1 tablet (10 mg total) by mouth daily. 30 tablet 0    No results found for this or any previous  visit (from the past 48 hour(s)). No results found.  ROS No recent fever, bleeding abnormalities, urologic dysfunction, GI problems, or weight gain.  Blood pressure (!) 143/69, pulse 78, temperature 99.4 F (37.4 C), temperature source Oral, resp. rate 17, height 5\' 4"  (1.626 m), weight 81.6 kg, SpO2 99 %. Physical Exam NCAT RRR LLE Pin sites with mild ulceration  Dressing intact, clean, dry  Edema/ swelling controlled and skin finally wrinkles in anticipated area of incision  Sens: DPN, SPN, TN intact  Motor: EHL, FHL, and lessor toe ext and flex all intact grossly  Brisk cap refill, warm to  touch  Assessment/Plan  Left tibial pilon  I discussed with the patient the risks and benefits of surgery, including the possibility of infection, nerve injury, vessel injury, wound breakdown, arthritis, symptomatic hardware, DVT/ PE, loss of motion, malunion, nonunion, and need for further surgery among others.  We also specifically discussed the elevated risk of soft tissue breakdown that could lead to amputation.  She acknowledged these risks and wished to proceed.  , MD [facility], [facility] [phone]  02/11/2021, 11:52 AM  [facility] [address] [place] [phone] (F)

## 2021-02-11 NOTE — Anesthesia Procedure Notes (Signed)
Anesthesia Regional Block: Popliteal block   Pre-Anesthetic Checklist: ,, timeout performed, Correct Patient, Correct Site, Correct Laterality, Correct Procedure, Correct Position, site marked, Risks and benefits discussed,  Surgical consent,  Pre-op evaluation,  At surgeon's request and post-op pain management  Laterality: Left  Prep: Dura Prep       Needles:  Injection technique: Single-shot  Needle Type: Echogenic Stimulator Needle     Needle Length: 10cm  Needle Gauge: 20     Additional Needles:   Procedures:,,,, ultrasound used (permanent image in chart),,,,  Narrative:  Start time: 02/11/2021 1:53 PM End time: 02/11/2021 1:55 PM Injection made incrementally with aspirations every 5 mL.  Performed by: Personally  Anesthesiologist: Atilano Median, DO  Additional Notes: Patient identified. Risks/Benefits/Options discussed with patient including but not limited to bleeding, infection, nerve damage, failed block, incomplete pain control. Patient expressed understanding and wished to proceed. All questions were answered. Sterile technique was used throughout the entire procedure. Please see nursing notes for vital signs. Aspirated in 5cc intervals with injection for negative confirmation. Patient was given instructions on fall risk and not to get out of bed. All questions and concerns addressed with instructions to call with any issues or inadequate analgesia.

## 2021-02-11 NOTE — Anesthesia Procedure Notes (Signed)
Procedure Name: Intubation Date/Time: 02/11/2021 2:31 PM Performed by: Shary Decamp, CRNA Pre-anesthesia Checklist: Patient identified, Patient being monitored, Timeout performed, Emergency Drugs available and Suction available Patient Re-evaluated:Patient Re-evaluated prior to induction Oxygen Delivery Method: Circle System Utilized Preoxygenation: Pre-oxygenation with 100% oxygen Induction Type: IV induction Ventilation: Mask ventilation without difficulty Laryngoscope Size: Miller and 2 Grade View: Grade I Tube type: Oral Tube size: 7.0 mm Number of attempts: 1 Airway Equipment and Method: Stylet Placement Confirmation: ETT inserted through vocal cords under direct vision,  positive ETCO2 and breath sounds checked- equal and bilateral Secured at: 21 cm Tube secured with: Tape Dental Injury: Teeth and Oropharynx as per pre-operative assessment

## 2021-02-11 NOTE — Progress Notes (Signed)
Covid swab walked down to lab @ 11:40.

## 2021-02-11 NOTE — Progress Notes (Signed)
Orthopedic Tech Progress Note Patient Details:  Darlene Casey 10-21-1980 [id number] Applied overhead frame to pts bed Patient ID: Darlene Casey, female   DOB: 10-25-1980, 40 y.o.   MRN: [id number]   Gerald Stabs 02/11/2021, 6:55 PM

## 2021-02-11 NOTE — Transfer of Care (Signed)
Immediate Anesthesia Transfer of Care Note  Patient: Darlene Casey  Procedure(s) Performed: OPEN REDUCTION INTERNAL FIXATION (ORIF) PILON FRACTURE (Left Ankle) REMOVAL EXTERNAL FIXATION LEG (Left Leg Lower)  Patient Location: PACU  Anesthesia Type:General  Level of Consciousness: drowsy  Airway & Oxygen Therapy: Patient Spontanous Breathing  Post-op Assessment: Report given to RN and Post -op Vital signs reviewed and stable  Post vital signs: Reviewed and stable  Last Vitals:  Vitals Value Taken Time  BP 114/55 02/11/21 1733  Temp    Pulse    Resp 18 02/11/21 1733  SpO2    Vitals shown include unvalidated device data.  Last Pain:  Vitals:   02/11/21 1406  TempSrc:   PainSc: 3       Patients Stated Pain Goal: 1 (02/11/21 1211)  Complications: No complications documented.

## 2021-02-11 NOTE — Anesthesia Preprocedure Evaluation (Addendum)
Anesthesia Evaluation  Patient identified by MRN, date of birth, ID band Patient awake    Reviewed: Allergy & Precautions, NPO status , Patient's Chart, lab work & pertinent test results  Airway Mallampati: II  TM Distance: >3 FB Neck ROM: Full    Dental  (+) Edentulous Upper   Pulmonary neg pulmonary ROS, Current Smoker and Patient abstained from smoking.,    Pulmonary exam normal        Cardiovascular hypertension,  Rhythm:Regular Rate:Normal     Neuro/Psych Depression Schizophrenia negative neurological ROS     GI/Hepatic GERD  ,(+) Hepatitis -, C  Endo/Other  negative endocrine ROS  Renal/GU negative Renal ROS  negative genitourinary   Musculoskeletal  (+) Fibromyalgia -, narcotic dependent  Abdominal (+)  Abdomen: soft. Bowel sounds: normal.  Peds  Hematology negative hematology ROS (+)   Anesthesia Other Findings   Reproductive/Obstetrics                           Anesthesia Physical Anesthesia Plan  ASA: II  Anesthesia Plan: General and Regional   Post-op Pain Management:  Regional for Post-op pain   Induction: Intravenous  PONV Risk Score and Plan: 2 and Ondansetron, Dexamethasone, Midazolam and Treatment may vary due to age or medical condition  Airway Management Planned: Mask and Oral ETT  Additional Equipment: None  Intra-op Plan:   Post-operative Plan: Extubation in OR  Informed Consent: I have reviewed the patients History and Physical, chart, labs and discussed the procedure including the risks, benefits and alternatives for the proposed anesthesia with the patient or authorized representative who has indicated his/her understanding and acceptance.     Dental advisory given  Plan Discussed with: CRNA  Anesthesia Plan Comments: (Lab Results      Component                Value               Date                      WBC                      11.7 (H)             02/11/2021                HGB                      14.4                02/11/2021                HCT                      43.7                02/11/2021                MCV                      93.8                02/11/2021                PLT  290                 02/11/2021           Lab Results      Component                Value               Date                      NA                       139                 02/11/2021                K                        3.4 (L)             02/11/2021                CO2                      27                  02/11/2021                GLUCOSE                  88                  02/11/2021                BUN                      8                   02/11/2021                CREATININE               0.71                02/11/2021                CALCIUM                  9.6                 02/11/2021                GFRNONAA                 >60                 02/11/2021                GFRAA                    >60                 01/06/2009          )       Anesthesia Quick Evaluation

## 2021-02-11 NOTE — Anesthesia Procedure Notes (Signed)
Anesthesia Regional Block: Adductor canal block   Pre-Anesthetic Checklist: ,, timeout performed, Correct Patient, Correct Site, Correct Laterality, Correct Procedure, Correct Position, site marked, Risks and benefits discussed,  Surgical consent,  Pre-op evaluation,  At surgeon's request and post-op pain management  Laterality: Left  Prep: Dura Prep       Needles:  Injection technique: Single-shot  Needle Type: Echogenic Stimulator Needle     Needle Length: 10cm  Needle Gauge: 20     Additional Needles:   Procedures:,,,, ultrasound used (permanent image in chart),,,,  Narrative:  Start time: 02/11/2021 1:50 PM End time: 02/11/2021 1:52 PM Injection made incrementally with aspirations every 5 mL.  Performed by: Personally  Anesthesiologist: Atilano Median, DO  Additional Notes: Patient identified. Risks/Benefits/Options discussed with patient including but not limited to bleeding, infection, nerve damage, failed block, incomplete pain control. Patient expressed understanding and wished to proceed. All questions were answered. Sterile technique was used throughout the entire procedure. Please see nursing notes for vital signs. Aspirated in 5cc intervals with injection for negative confirmation. Patient was given instructions on fall risk and not to get out of bed. All questions and concerns addressed with instructions to call with any issues or inadequate analgesia.

## 2021-02-12 ENCOUNTER — Encounter (HOSPITAL_COMMUNITY): Payer: Self-pay | Admitting: Orthopedic Surgery

## 2021-02-12 DIAGNOSIS — S82872A Displaced pilon fracture of left tibia, initial encounter for closed fracture: Secondary | ICD-10-CM | POA: Diagnosis not present

## 2021-02-12 LAB — HEPATITIS PANEL, ACUTE
HCV Ab: REACTIVE — AB
Hep A IgM: NONREACTIVE
Hep B C IgM: NONREACTIVE
Hepatitis B Surface Ag: NONREACTIVE

## 2021-02-12 LAB — BASIC METABOLIC PANEL
Anion gap: 7 (ref 5–15)
BUN: 11 mg/dL (ref 6–20)
CO2: 26 mmol/L (ref 22–32)
Calcium: 9 mg/dL (ref 8.9–10.3)
Chloride: 105 mmol/L (ref 98–111)
Creatinine, Ser: 0.77 mg/dL (ref 0.44–1.00)
GFR, Estimated: >60 mL/min (ref >60)
Glucose, Bld: 180 mg/dL — ABNORMAL HIGH (ref 70–99)
Potassium: 3.8 mmol/L (ref 3.5–5.1)
Sodium: 138 mmol/L (ref 135–145)

## 2021-02-12 LAB — CBC
HCT: 36.1 % (ref 36.0–46.0)
Hemoglobin: 12.3 g/dL (ref 12.0–15.0)
MCH: 31.5 pg (ref 26.0–34.0)
MCHC: 34.1 g/dL (ref 30.0–36.0)
MCV: 92.6 fL (ref 80.0–100.0)
Platelets: 259 10*3/uL (ref 150–400)
RBC: 3.9 MIL/uL (ref 3.87–5.11)
RDW: 12.4 % (ref 11.5–15.5)
WBC: 12.7 10*3/uL — ABNORMAL HIGH (ref 4.0–10.5)
nRBC: 0 % (ref 0.0–0.2)

## 2021-02-12 MED ORDER — APIXABAN 2.5 MG PO TABS: 2.5000 mg | ORAL_TABLET | Freq: Two times a day (BID) | ORAL | Status: DC

## 2021-02-12 MED ORDER — METHOCARBAMOL 500 MG PO TABS: 500.0000 mg | ORAL_TABLET | Freq: Three times a day (TID) | ORAL | 0 refills | Status: AC | PRN

## 2021-02-12 MED ORDER — APIXABAN 2.5 MG PO TABS: 2.5000 mg | ORAL_TABLET | Freq: Two times a day (BID) | ORAL | 0 refills | Status: AC

## 2021-02-12 MED ORDER — OXYCODONE-ACETAMINOPHEN 7.5-325 MG PO TABS: 1.0000 | ORAL_TABLET | Freq: Three times a day (TID) | ORAL | 0 refills | Status: AC | PRN

## 2021-02-12 MED ORDER — ACETAMINOPHEN 325 MG PO TABS: 650.0000 mg | ORAL_TABLET | Freq: Two times a day (BID) | ORAL | 0 refills | Status: AC

## 2021-02-12 NOTE — Evaluation (Signed)
Occupational Therapy Evaluation Patient Details Name: Darlene Casey MRN: [id number] DOB: 09/01/1981 Today's Date: 02/12/2021    History of Present Illness Pt adm 5/19 for definitive repair of external fixation that was completed on 4/7 after she was admitted with complex lt pilon fx of tibia and fibula after a fall down a ravine while hiking. PMH - schizophrenia, htn, fibromyalgia.   Clinical Impression   Pt admitted for the procedure listed above. PTA pt was mod I at home, using a RW for mobility and other DME as needed with ADL's. Pt lives with her partner, who can provide assistance if needed. Throughout the evaluation, pt required supervision for mobility and standing ADL's for safety, otherwise, pt is following her NWB precautions well and will not need further OT outside of the hospital. Acute OT will continue to follow pt to address concerns listed below.    Follow Up Recommendations  No OT follow up    Equipment Recommendations  None recommended by OT    Recommendations for Other Services       Precautions / Restrictions Precautions Precautions: Other (comment) (NWB LLE, tibial fx) Required Braces or Orthoses: Splint/Cast Restrictions Weight Bearing Restrictions: Yes LLE Weight Bearing: Non weight bearing      Mobility Bed Mobility Overal bed mobility: Independent             General bed mobility comments: Able to get in and out of bed with no difficulties.    Transfers Overall transfer level: Modified independent               General transfer comment: Pt able to sit and stand safely with no assistance.    Balance Overall balance assessment: Mild deficits observed, not formally tested                                         ADL either performed or assessed with clinical judgement   ADL Overall ADL's : Needs assistance/impaired Eating/Feeding: Independent;Sitting   Grooming: Wash/dry hands;Wash/dry  face;Standing;Supervision/safety Grooming Details (indicate cue type and reason): sup for safety Upper Body Bathing: Sitting;Modified independent   Lower Body Bathing: Supervison/ safety;Sit to/from stand;Sitting/lateral leans Lower Body Bathing Details (indicate cue type and reason): Sup for safety Upper Body Dressing : Independent;Sitting   Lower Body Dressing: Supervision/safety;Sitting/lateral leans;Sit to/from stand Lower Body Dressing Details (indicate cue type and reason): Sup for safety, able to reach both feet to doff and don socks and shoes Toilet Transfer: Supervision/safety;Ambulation;RW Toilet Transfer Details (indicate cue type and reason): Sup for safety, no difficulties using toilet in bathroom. Toileting- Clothing Manipulation and Hygiene: Supervision/safety;Sitting/lateral lean;Sit to/from stand Toileting - Architect Details (indicate cue type and reason): sup for safety, able to complete pericare and clothing management with no difficulties Tub/ Shower Transfer: Supervision/safety;Ambulation;Tub bench Tub/Shower Transfer Details (indicate cue type and reason): Sup for safety Functional mobility during ADLs: Supervision/safety;Rolling walker General ADL Comments: Pt having no difficulties with ambulating in room. Reports that she would rather use the RW instead of crutches because she feels safer.     Vision Baseline Vision/History: No visual deficits Patient Visual Report: No change from baseline Vision Assessment?: No apparent visual deficits     Perception Perception Perception Tested?: No   Praxis Praxis Praxis tested?: Not tested    Pertinent Vitals/Pain Pain Assessment: 0-10 Pain Score: 6  Pain Location: LLE Pain Descriptors / Indicators: Aching;Discomfort;Guarding;Sore Pain Intervention(s):  Monitored during session;Repositioned;RN gave pain meds during session     Hand Dominance Right   Extremity/Trunk Assessment Upper Extremity  Assessment Upper Extremity Assessment: Overall WFL for tasks assessed   Lower Extremity Assessment Lower Extremity Assessment: Defer to PT evaluation   Cervical / Trunk Assessment Cervical / Trunk Assessment: Normal   Communication Communication Communication: No difficulties   Cognition Arousal/Alertness: Awake/alert Behavior During Therapy: WFL for tasks assessed/performed Overall Cognitive Status: Within Functional Limits for tasks assessed                                     General Comments  VSS on RA    Exercises     Shoulder Instructions      Home Living Family/patient expects to be discharged to:: Private residence Living Arrangements: Spouse/significant other Available Help at Discharge: Family Type of Home: House Home Access: Stairs to enter Secretary/administrator of Steps: 4 steps Entrance Stairs-Rails: Right Home Layout: One level     Bathroom Shower/Tub: Chief Strategy Officer: Standard Bathroom Accessibility: Yes How Accessible: Accessible via walker Home Equipment: Walker - 2 wheels;Bedside commode;Tub bench          Prior Functioning/Environment Level of Independence: Independent with assistive device(s)        Comments: Using RW after inital surgery on 4/7        OT Problem List: Decreased strength;Decreased activity tolerance;Impaired balance (sitting and/or standing);Decreased safety awareness;Decreased knowledge of use of DME or AE      OT Treatment/Interventions: Self-care/ADL training;Therapeutic exercise;Energy conservation;DME and/or AE instruction;Therapeutic activities;Patient/family education;Balance training    OT Goals(Current goals can be found in the care plan section) Acute Rehab OT Goals Patient Stated Goal: To go home and get stronger OT Goal Formulation: With patient Time For Goal Achievement: 02/26/21 Potential to Achieve Goals: Good ADL Goals Pt Will Transfer to Toilet: with modified  independence;ambulating Additional ADL Goal #1: Pt will tolerate standing at the sink, following her NWB on LLE, and complete 3 grooming tasks. Additional ADL Goal #2: Pt will verbalize 3 fall prevention techniques.  OT Frequency: Min 2X/week   Barriers to D/C:            Co-evaluation              AM-PAC OT "6 Clicks" Daily Activity     Outcome Measure Help from another person eating meals?: None Help from another person taking care of personal grooming?: A Little Help from another person toileting, which includes using toliet, bedpan, or urinal?: A Little Help from another person bathing (including washing, rinsing, drying)?: A Little Help from another person to put on and taking off regular upper body clothing?: None Help from another person to put on and taking off regular lower body clothing?: A Little 6 Click Score: 20   End of Session Equipment Utilized During Treatment: Rolling walker Nurse Communication: Mobility status;Patient requests pain meds  Activity Tolerance: Patient tolerated treatment well Patient left: in bed;with call bell/phone within reach;with family/visitor present  OT Visit Diagnosis: Unsteadiness on feet (R26.81);Other abnormalities of gait and mobility (R26.89);Muscle weakness (generalized) (M62.81)                Time: 5597-4163 OT Time Calculation (min): 18 min Charges:  OT General Charges $OT Visit: 1 Visit OT Evaluation $OT Eval Moderate Complexity: 1 Mod  Haidyn Kilburg H., OTR/L Acute Rehabilitation  Aarushi Hemric Elane Bing Plume 02/12/2021, 1:19  PM

## 2021-02-12 NOTE — Discharge Instructions (Addendum)
Orthopaedic Trauma Service Discharge Instructions   General Discharge Instructions  Orthopaedic Injuries:  Left distal tibia and fibula fracture treated with open reduction and internal fixation with plate and screws   WEIGHT BEARING STATUS: Nonweightbearing Left leg. Use walker or crutches   RANGE OF MOTION/ACTIVITY: ok to move toes and knee.  Do not remove splint   Bone health: continue with vitamin D  Wound Care: do not remove splint. Do not get splint wet   DVT/PE prophylaxis: eliquis 2.5 mg 2x a day x 4 weeks   Diet: as you were eating previously.  Can use over the counter stool softeners and bowel preparations, such as Miralax, to help with bowel movements.  Narcotics can be constipating.  Be sure to drink plenty of fluids  PAIN MEDICATION USE AND EXPECTATIONS  You have likely been given narcotic medications to help control your pain.  After a traumatic event that results in an fracture (broken bone) with or without surgery, it is ok to use narcotic pain medications to help control one's pain.  We understand that everyone responds to pain differently and each individual patient will be evaluated on a regular basis for the continued need for narcotic medications. Ideally, narcotic medication use should last no more than 6-8 weeks (coinciding with fracture healing).   As a patient it is your responsibility as well to monitor narcotic medication use and report the amount and frequency you use these medications when you come to your office visit.   We would also advise that if you are using narcotic medications, you should take a dose prior to therapy to maximize you participation.  IF YOU ARE ON NARCOTIC MEDICATIONS IT IS NOT PERMISSIBLE TO OPERATE A MOTOR VEHICLE (MOTORCYCLE/CAR/TRUCK/MOPED) OR HEAVY MACHINERY DO NOT MIX NARCOTICS WITH OTHER CNS (CENTRAL NERVOUS SYSTEM) DEPRESSANTS SUCH AS ALCOHOL   POST-OPERATIVE OPIOID TAPER INSTRUCTIONS: . It is important to wean off of your  opioid medication as soon as possible. If you do not need pain medication after your surgery it is ok to stop day one. Marland Kitchen. Opioids include: o Codeine, Hydrocodone(Norco, Vicodin), Oxycodone(Percocet, oxycontin) and hydromorphone amongst others.  . Long term and even short term use of opiods can cause: o Increased pain response o Dependence o Constipation o Depression o Respiratory depression o And more.  . Withdrawal symptoms can include o Flu like symptoms o Nausea, vomiting o And more . Techniques to manage these symptoms o Hydrate well o Eat regular healthy meals o Stay active o Use relaxation techniques(deep breathing, meditating, yoga) . Do Not substitute Alcohol to help with tapering . If you have been on opioids for less than two weeks and do not have pain than it is ok to stop all together.  . Plan to wean off of opioids o This plan should start within one week post op of your fracture surgery  o Maintain the same interval or time between taking each dose and first decrease the dose.  o Cut the total daily intake of opioids by one tablet each day o Next start to increase the time between doses. o The last dose that should be eliminated is the evening dose.    STOP SMOKING OR USING NICOTINE PRODUCTS!!!!  As discussed nicotine severely impairs your body's ability to heal surgical and traumatic wounds but also impairs bone healing.  Wounds and bone heal by forming microscopic blood vessels (angiogenesis) and nicotine is a vasoconstrictor (essentially, shrinks blood vessels).  Therefore, if vasoconstriction occurs to these  microscopic blood vessels they essentially disappear and are unable to deliver necessary nutrients to the healing tissue.  This is one modifiable factor that you can do to dramatically increase your chances of healing your injury.    (This means no smoking, no nicotine gum, patches, etc)  DO NOT USE NONSTEROIDAL ANTI-INFLAMMATORY DRUGS (NSAID'S)  Using products  such as Advil (ibuprofen), Aleve (naproxen), Motrin (ibuprofen) for additional pain control during fracture healing can delay and/or prevent the healing response.  If you would like to take over the counter (OTC) medication, Tylenol (acetaminophen) is ok.  However, some narcotic medications that are given for pain control contain acetaminophen as well. Therefore, you should not exceed more than 4000 mg of tylenol in a day if you do not have liver disease.  Also note that there are may OTC medicines, such as cold medicines and allergy medicines that my contain tylenol as well.  If you have any questions about medications and/or interactions please ask your doctor/PA or your pharmacist.      ICE AND ELEVATE INJURED/OPERATIVE EXTREMITY  Using ice and elevating the injured extremity above your heart can help with swelling and pain control.  Icing in a pulsatile fashion, such as 20 minutes on and 20 minutes off, can be followed.    Do not place ice directly on skin. Make sure there is a barrier between to skin and the ice pack.    Using frozen items such as frozen peas works well as the conform nicely to the are that needs to be iced.  USE AN ACE WRAP OR TED HOSE FOR SWELLING CONTROL  In addition to icing and elevation, Ace wraps or TED hose are used to help limit and resolve swelling.  It is recommended to use Ace wraps or TED hose until you are informed to stop.    When using Ace Wraps start the wrapping distally (farthest away from the body) and wrap proximally (closer to the body)   Example: If you had surgery on your leg or thing and you do not have a splint on, start the ace wrap at the toes and work your way up to the thigh        If you had surgery on your upper extremity and do not have a splint on, start the ace wrap at your fingers and work your way up to the upper arm  IF YOU ARE IN A SPLINT OR CAST DO NOT REMOVE IT FOR ANY REASON   If your splint gets wet for any reason please contact the  office immediately. You may shower in your splint or cast as long as you keep it dry.  This can be done by wrapping in a cast cover or garbage back (or similar)  Do Not stick any thing down your splint or cast such as pencils, money, or hangers to try and scratch yourself with.  If you feel itchy take benadryl as prescribed on the bottle for itching  IF YOU ARE IN A CAM BOOT (BLACK BOOT)  You may remove boot periodically. Perform daily dressing changes as noted below.  Wash the liner of the boot regularly and wear a sock when wearing the boot. It is recommended that you sleep in the boot until told otherwise    Call office for the following:  Temperature greater than 101F  Persistent nausea and vomiting  Severe uncontrolled pain  Redness, tenderness, or signs of infection (pain, swelling, redness, odor or green/yellow discharge around the site)  Difficulty breathing, headache or visual disturbances  Hives  Persistent dizziness or light-headedness  Extreme fatigue  Any other questions or concerns you may have after discharge  In an emergency, call 911 or go to an Emergency Department at a nearby hospital  HELPFUL INFORMATION  ? If you had a block, it will wear off between 8-24 hrs postop typically.  This is period when your pain may go from nearly zero to the pain you would have had postop without the block.  This is an abrupt transition but nothing dangerous is happening.  You may take an extra dose of narcotic when this happens.  ? You should wean off your narcotic medicines as soon as you are able.  Most patients will be off or using minimal narcotics before their first postop appointment.   ? We suggest you use the pain medication the first night prior to going to bed, in order to ease any pain when the anesthesia wears off. You should avoid taking pain medications on an empty stomach as it will make you nauseous.  ? Do not drink alcoholic beverages or take illicit drugs when  taking pain medications.  ? In most states it is against the law to drive while you are in a splint or sling.  And certainly against the law to drive while taking narcotics.  ? You may return to work/school in the next couple of days when you feel up to it.   ? Pain medication may make you constipated.  Below are a few solutions to try in this order: - Decrease the amount of pain medication if you aren't having pain. - Drink lots of decaffeinated fluids. - Drink prune juice and/or each dried prunes  o If the first 3 don't work start with additional solutions - Take Colace - an over-the-counter stool softener - Take Senokot - an over-the-counter laxative - Take Miralax - a stronger over-the-counter laxative     CALL THE OFFICE WITH ANY QUESTIONS OR CONCERNS: [phone]   VISIT OUR WEBSITE FOR ADDITIONAL INFORMATION: [email or web address]     Cast or Splint Care, Adult Casts and splints are supports that are worn to protect broken bones and other injuries. A cast or splint may hold a bone still and in the correct position while it heals. Casts and splints may also help to ease pain, swelling, and muscle spasms. A cast is a hardened support that is usually made of fiberglass or plaster. It is custom-fit to the body and offers more protection than a splint. Most casts cannot be taken off and put back on. A splint is a type of soft support that is usually made from cloth and elastic. It can be adjusted or taken off as needed. Often, splints are used on broken bones at first. Later, a cast can replace the splint after the swelling goes down. What are the risks? In some cases, wearing a cast or splint can cause a reduced blood supply to the wrist or hand or to the foot and toes. This can happen if there is a lot of swelling or if the cast or splint is too tight. Limited blood supply can cause a problem called compartment syndrome. This can lead to lasting damage. Symptoms include:  Pain that  is getting worse.  Numbness and tingling.  Changes in skin color, including paleness or a bluish color.  Cold fingers or toes. Other problems from wearing a cast or splint can include:  Skin irritation that can cause: ? Itching. ?  Rash. ? Skin sores. ? Skin infection.  Limb stiffness or weakness. How to care for your cast  Check the skin around it every day. Tell your doctor about any concerns.  Do not stick anything inside it to scratch your skin.  You may put lotion on dry skin around the edges of the cast. Do not put lotion on the skin underneath it.  Keep it clean and dry.   How to care for your splint  Wear the splint as told by your doctor. Take it off only as told by your doctor.  Check the skin around it every day. Tell your doctor about any concerns.  Loosen it if your fingers or toes tingle, get numb, or turn cold and blue.  Keep it clean and dry. Clean your splint as told by your doctor. Use mild soap and water and let it air-dry. Do not use heat on the splint. Follow these instructions at home: Bathing  Do not take baths, swim, or use a hot tub until your doctor approves. Ask your doctor if you may take showers. You may only be allowed to take sponge baths.  If the cast or splint is not waterproof: ? Do not let it get wet. ? Cover it with a watertight covering when you take a bath or a shower. Managing pain, stiffness, and swelling  If told, put ice on the affected area. To do this: ? If you have a cast or splint that can be taken off, take it off as told by your doctor. ? Put ice in a plastic bag. ? Place a towel between your skin and the bag or between your cast and the bag. ? Leave the ice on for 20 minutes, 2-3 times a day.  Move your fingers or toes often.  Raise (elevate) the injured area above the level of your heart while you are sitting or lying down.   Safety  Do not use your injured leg or foot to support your body weight until your doctor  says that you can.  Use crutches or other helpful (assistive) devices as told by your doctor.  Ask your doctor when it is safe to drive if you have a cast or splint on part of your body. General instructions  Do not put pressure on any part of the cast or splint until it is fully hardened. This may take many hours.  Take over-the-counter and prescription medicines only as told by your doctor.  Do not use any products that contain nicotine or tobacco, such as cigarettes, e-cigarettes, and chewing tobacco. These can delay healing. If you need help quitting, ask your doctor.  Return to your normal activities as told by your doctor. Ask your doctor what activities are safe for you.  Keep all follow-up visits as told by your doctor. This is important. Contact a doctor if:  The skin around the cast or splint gets red or raw.  The skin under the cast is very itchy or painful.  Your cast or splint: ? Gets damaged. ? Feels very uncomfortable. ? Is too tight or too loose.  Your cast becomes wet or it starts to have a soft spot or area.  There is a bad smell coming from under your cast.  You get an object stuck under your cast. Get help right away if:  You get any symptoms of compartment syndrome, such as: ? Very bad pain or pressure under the cast. ? Numbness, tingling, coldness, or pale or bluish  skin.  The part of your body above or below the cast is swollen, and it turns a different color (is discolored).  You cannot feel or move your fingers or toes.  Your pain gets worse.  There is fluid leaking through the cast.  You have trouble breathing or shortness of breath.  You have chest pain. Summary  Casts and splints are worn to protect broken bones and other injuries.  Most casts cannot be taken off, and most splints can be taken off.  Keep your cast or splint clean and dry.  Take off your cast or splint only as told by your doctor.  Get help right away if you have  very bad pain, numbness, tingling, or skin that turns cold or another color. This information is not intended to replace advice given to you by your health care provider. Make sure you discuss any questions you have with your health care provider. Document Revised: 05/30/2019 Document Reviewed: 05/30/2019 Elsevier Patient Education  2021 [employer].   Information on my medicine - ELIQUIS (apixaban)  This medication education was reviewed with me or my healthcare representative as part of my discharge preparation.  Why was Eliquis prescribed for you? Eliquis was prescribed for you to reduce the risk of blood clots forming after orthopedic surgery.    What do You need to know about Eliquis? Take your Eliquis TWICE DAILY - one tablet in the morning and one tablet in the evening with or without food.  It would be best to take the dose about the same time each day.  If you have difficulty swallowing the tablet whole please discuss with your pharmacist how to take the medication safely.  Take Eliquis exactly as prescribed by your doctor and DO NOT stop taking Eliquis without talking to the doctor who prescribed the medication.  Stopping without other medication to take the place of Eliquis may increase your risk of developing a clot.  After discharge, you should have regular check-up appointments with your healthcare provider that is prescribing your Eliquis.  What do you do if you miss a dose? If a dose of ELIQUIS is not taken at the scheduled time, take it as soon as possible on the same day and twice-daily administration should be resumed.  The dose should not be doubled to make up for a missed dose.  Do not take more than one tablet of ELIQUIS at the same time.  Important Safety Information A possible side effect of Eliquis is bleeding. You should call your healthcare provider right away if you experience any of the following: ? Bleeding from an injury or your nose that does  not stop. ? Unusual colored urine (red or dark brown) or unusual colored stools (red or black). ? Unusual bruising for unknown reasons. ? A serious fall or if you hit your head (even if there is no bleeding).  Some medicines may interact with Eliquis and might increase your risk of bleeding or clotting while on Eliquis. To help avoid this, consult your healthcare provider or pharmacist prior to using any new prescription or non-prescription medications, including herbals, vitamins, non-steroidal anti-inflammatory drugs (NSAIDs) and supplements.  This website has more information on Eliquis (apixaban): http://www.eliquis.com/eliquis/home

## 2021-02-12 NOTE — Progress Notes (Signed)
Orthopedic Tech Progress Note Patient Details:  Darlene Casey 06/24/1981 [id number]   Ortho Devices Type of Ortho Device: CAM walker Ortho Device/Splint Location: LLE Ortho Device/Splint Interventions: Ordered   Post Interventions Instructions Provided: Care of device,Poper ambulation with device   Docia Furl 02/12/2021, 2:52 PM

## 2021-02-12 NOTE — Plan of Care (Signed)
°  Problem: Activity: °Goal: Risk for activity intolerance will decrease °Outcome: Progressing °  °Problem: Nutrition: °Goal: Adequate nutrition will be maintained °Outcome: Progressing °  °Problem: Elimination: °Goal: Will not experience complications related to bowel motility °Outcome: Progressing °  °Problem: Safety: °Goal: Ability to remain free from injury will improve °Outcome: Progressing °  °

## 2021-02-12 NOTE — Anesthesia Postprocedure Evaluation (Signed)
Anesthesia Post Note  Patient: Darlene Casey  Procedure(s) Performed: OPEN REDUCTION INTERNAL FIXATION (ORIF) PILON FRACTURE (Left Ankle) REMOVAL EXTERNAL FIXATION LEG (Left Leg Lower)     Patient location during evaluation: PACU Anesthesia Type: General Level of consciousness: awake and alert Pain management: pain level controlled Vital Signs Assessment: post-procedure vital signs reviewed and stable Respiratory status: spontaneous breathing, nonlabored ventilation, respiratory function stable and patient connected to nasal cannula oxygen Cardiovascular status: blood pressure returned to baseline and stable Postop Assessment: no apparent nausea or vomiting Anesthetic complications: no   No complications documented.  Last Vitals:  Vitals:   02/11/21 2333 02/12/21 0422  BP: 133/67 (!) 154/93  Pulse: 76 74  Resp: 18 18  Temp: 36.8 C 36.7 C  SpO2: 98% 98%    Last Pain:  Vitals:   02/12/21 0445  TempSrc:   PainSc: [name]

## 2021-02-12 NOTE — Progress Notes (Signed)
[facility] Progress Note  Patient ID: Darlene Casey MRN: [id number] DOB/AGE: 1981-01-07 40 y.o.  Subjective:  Doing ok  Block wore off early this am, pain pretty severe at the moment No other complaints   No CP or SOB, no N/V  ROS As above  Objective:   VITALS:   Vitals:   02/11/21 1833 02/11/21 2056 02/11/21 2333 02/12/21 0422  BP: 126/63 124/61 133/67 (!) 154/93  Pulse: 72 85 76 74  Resp:  17 18 18   Temp: 97.6 F (36.4 C) 98.5 F (36.9 C) 98.2 F (36.8 C) 98 F (36.7 C)  TempSrc: Oral Oral Oral   SpO2: 100% 97% 98% 98%  Weight:      Height:        Estimated body mass index is 30.9 kg/m as calculated from the following:   Height as of this encounter: 5\' 4"  (1.626 m).   Weight as of this encounter: 81.6 kg.   Intake/Output      05/19 0701 05/20 0700 05/20 0701 05/21 0700   I.V. (mL/kg) 1344.9 (16.5)    IV Piggyback 173.5    Total Intake(mL/kg) 1518.4 (18.6)    Urine (mL/kg/hr) 0    Blood 200    Total Output 200    Net +1318.4         Urine Occurrence 3 x      LABS  Results for orders placed or performed during the hospital encounter of 02/11/21 (from the past 24 hour(s))  SARS Coronavirus 2 by RT PCR (hospital order, performed in [facility] hospital lab) Nasopharyngeal Nasopharyngeal Swab     Status: None   Collection Time: 02/11/21 11:38 AM   Specimen: Nasopharyngeal Swab  Result Value Ref Range   SARS Coronavirus 2 NEGATIVE NEGATIVE  CBC WITH DIFFERENTIAL     Status: Abnormal   Collection Time: 02/11/21 11:40 AM  Result Value Ref Range   WBC 11.7 (H) 4.0 - 10.5 K/uL   RBC 4.66 3.87 - 5.11 MIL/uL   Hemoglobin 14.4 12.0 - 15.0 g/dL   HCT 02/13/21 02/13/21 - 16.9 %   MCV 93.8 80.0 - 100.0 fL   MCH 30.9 26.0 - 34.0 pg   MCHC 33.0 30.0 - 36.0 g/dL   RDW 67.8 93.8 - 10.1 %   Platelets 290 150 - 400 K/uL   nRBC 0.0 0.0 - 0.2 %   Neutrophils Relative % 75 %    Neutro Abs 8.9 (H) 1.7 - 7.7 K/uL   Lymphocytes Relative 18 %   Lymphs Abs 2.1 0.7 - 4.0 K/uL   Monocytes Relative 5 %   Monocytes Absolute 0.6 0.1 - 1.0 K/uL   Eosinophils Relative 1 %   Eosinophils Absolute 0.1 0.0 - 0.5 K/uL   Basophils Relative 1 %   Basophils Absolute 0.1 0.0 - 0.1 K/uL   Immature Granulocytes 0 %   Abs Immature Granulocytes 0.03 0.00 - 0.07 K/uL  Comprehensive metabolic panel     Status: Abnormal   Collection Time: 02/11/21 11:40 AM  Result Value Ref Range   Sodium 139 135 - 145 mmol/L   Potassium 3.4 (L) 3.5 - 5.1 mmol/L   Chloride 104 98 - 111 mmol/L   CO2 27 22 - 32 mmol/L   Glucose, Bld 88 70 - 99 mg/dL   BUN  8 6 - 20 mg/dL   Creatinine, Ser 3.55 0.44 - 1.00 mg/dL   Calcium 9.6 8.9 - 73.2 mg/dL   Total Protein 7.2 6.5 - 8.1 g/dL   Albumin 4.3 3.5 - 5.0 g/dL   AST 21 15 - 41 U/L   ALT 21 0 - 44 U/L   Alkaline Phosphatase 58 38 - 126 U/L   Total Bilirubin 0.5 0.3 - 1.2 mg/dL   GFR, Estimated >20 >25 mL/min   Anion gap 8 5 - 15  Protime-INR     Status: None   Collection Time: 02/11/21 11:40 AM  Result Value Ref Range   Prothrombin Time 12.9 11.4 - 15.2 seconds   INR 1.0 0.8 - 1.2  Surgical pcr screen     Status: None   Collection Time: 02/11/21 11:47 AM   Specimen: Nasal Mucosa; Nasal Swab  Result Value Ref Range   MRSA, PCR NEGATIVE NEGATIVE   Staphylococcus aureus NEGATIVE NEGATIVE  Pregnancy, urine POC     Status: None   Collection Time: 02/11/21 12:37 PM  Result Value Ref Range   Preg Test, Ur NEGATIVE NEGATIVE  Hepatitis panel, acute     Status: Abnormal   Collection Time: 02/11/21  6:42 PM  Result Value Ref Range   Hepatitis B Surface Ag NON REACTIVE NON REACTIVE   HCV Ab Reactive (A) NON REACTIVE   Hep A IgM NON REACTIVE NON REACTIVE   Hep B C IgM NON REACTIVE NON REACTIVE  CBC     Status: None   Collection Time: 02/11/21  6:42 PM  Result Value Ref Range   WBC 7.7 4.0 - 10.5 K/uL   RBC 4.27 3.87 - 5.11 MIL/uL   Hemoglobin 13.4  12.0 - 15.0 g/dL   HCT 42.7 06.2 - 37.6 %   MCV 93.4 80.0 - 100.0 fL   MCH 31.4 26.0 - 34.0 pg   MCHC 33.6 30.0 - 36.0 g/dL   RDW 28.3 15.1 - 76.1 %   Platelets 253 150 - 400 K/uL   nRBC 0.0 0.0 - 0.2 %  Creatinine, serum     Status: None   Collection Time: 02/11/21  6:42 PM  Result Value Ref Range   Creatinine, Ser 0.88 0.44 - 1.00 mg/dL   GFR, Estimated >60 >73 mL/min  CBC     Status: Abnormal   Collection Time: 02/12/21  1:40 AM  Result Value Ref Range   WBC 12.7 (H) 4.0 - 10.5 K/uL   RBC 3.90 3.87 - 5.11 MIL/uL   Hemoglobin 12.3 12.0 - 15.0 g/dL   HCT 71.0 62.6 - 94.8 %   MCV 92.6 80.0 - 100.0 fL   MCH 31.5 26.0 - 34.0 pg   MCHC 34.1 30.0 - 36.0 g/dL   RDW 54.6 27.0 - 35.0 %   Platelets 259 150 - 400 K/uL   nRBC 0.0 0.0 - 0.2 %  Basic metabolic panel     Status: Abnormal   Collection Time: 02/12/21  1:40 AM  Result Value Ref Range   Sodium 138 135 - 145 mmol/L   Potassium 3.8 3.5 - 5.1 mmol/L   Chloride 105 98 - 111 mmol/L   CO2 26 22 - 32 mmol/L   Glucose, Bld 180 (H) 70 - 99 mg/dL   BUN 11 6 - 20 mg/dL   Creatinine, Ser 0.93 0.44 - 1.00 mg/dL   Calcium 9.0 8.9 - 81.8 mg/dL   GFR, Estimated >29 >93 mL/min   Anion gap 7 5 -  15     PHYSICAL EXAM:   Gen: NAD, appears well Lungs: unlabored Cardiac: RRR Ext:       Left Lower Extremity   Splint c/d/i  Ext warm   Motor and sensory functions intact  + DP pulse  No pain out of proportion with passive stretching   Swelling stable    Assessment/Plan: 1 Day Post-Op   Principal Problem:   Closed left pilon fracture, initial encounter Active Problems:   Depression   Hepatitis C test positive   Hypertension   Schizophrenia (HCC)   GERD (gastroesophageal reflux disease)   Vitamin D deficiency   Nicotine dependence   History of substance abuse (HCC)   Anti-infectives (From admission, onward)   Start     Dose/Rate Route Frequency Ordered Stop   02/11/21 2030  ceFAZolin (ANCEF) IVPB 1 g/50 mL premix         1 g 100 mL/hr over 30 Minutes Intravenous Every 6 hours 02/11/21 1835 02/12/21 1429   02/11/21 1145  ceFAZolin (ANCEF) IVPB 2g/100 mL premix        2 g 200 mL/hr over 30 Minutes Intravenous On call to O.R. 02/11/21 1140 02/11/21 1435   02/11/21 1145  ceFAZolin (ANCEF) 2-4 GM/100ML-% IVPB       Note to Pharmacy: Amanda Pea   : cabinet override      02/11/21 1145 02/11/21 1421    .  POD/HD#: 1  40 y/o female s/p delayed ORIF L pilon fracture, tibia only   - Left pilon fracture s/p delayed ORIF due to soft tissue swelling   NWB L leg x 6 weeks  Splint x 2 weeks then convert to CAM  PT/OT  Ice and elevate   Plan for dc home tomorrow  - Pain management:  Multimodal  - ABL anemia/Hemodynamics  Stable  - DVT/PE prophylaxis:  eliquis 2.5 mg po BID x 4 weeks   - ID:   Ancef for periop coverage  - Metabolic Bone Disease:  Vitamin d deficiency    Supplement    - FEN/GI prophylaxis/Foley/Lines:  Reg diet   -Ex-fix/Splint care:  Keep splint clean and dry    - Impediments to fracture healing:  Nicotine use  Vitamin d deficiency   - Dispo:  Therapy evals  Home tomorrow   meds to be sent to pharmacy in [place], PA-C [phone] (C) 02/12/2021, 10:17 AM  [facility] [address] [place] [address] [phone] Collier Bullock (F)    After 5pm and on the weekends please log on to [employer], go to orthopaedics and the look under the [facility] Call for the provider(s) on call. You can also call our office at [phone] and then follow the prompts to be connected to the call team.

## 2021-02-13 LAB — HCV RNA QUANT RFLX ULTRA OR GENOTYP
HCV RNA Qnt(log copy/mL): 5.76 log10 IU/mL
HepC Qn: 575000 IU/mL

## 2021-02-13 LAB — HEPATITIS C GENOTYPE

## 2021-03-03 NOTE — Discharge Summary (Signed)
Orthopaedic Trauma Service (OTS) Discharge Summary   Patient ID: Darlene Casey MRN: [id number] DOB/AGE: [birth year] 41 y.o.  Admit date: 02/11/2021 Discharge date: 02/12/2021  Admission Diagnoses: Closed left pilon fracture status post external fixation Depression History of hepatitis C Schizophrenia Hypertension Vitamin D deficiency Nicotine dependence History of substance abuse   Discharge Diagnoses:  Principal Problem:   Closed left pilon fracture, initial encounter Active Problems:   Depression   Hepatitis C test positive   Hypertension   Schizophrenia (HCC)   GERD (gastroesophageal reflux disease)   Vitamin D deficiency   Nicotine dependence   History of substance abuse (HCC)   Past Medical History:  Diagnosis Date  . Closed left fibular fracture 01/01/2021  . Depression   . Fibromyalgia   . GERD (gastroesophageal reflux disease)   . Hepatitis    Hep C test positive several yrs ago but has been tested negative since then per patient  . Hepatitis C test positive    2-3 years ago. Pt has had a negative test since then.  . History of substance abuse (HCC) 01/01/2021  . Hypertension   . Nicotine dependence 01/01/2021  . Schizophrenia (HCC)    has ECT treatment  . Vitamin D deficiency 01/01/2021     Procedures Performed: 02/11/2021-Dr. Carola Frost Removal of external fixator left ankle ORIF left pilon  Discharged Condition: good  Hospital Course:   40 year old female well-known to the orthopedic trauma service.  Patient sustained a fall at the beginning of April and was referred to [place] from [facility].  she was placed into an external fixator to stabilize her fracture to allow for soft tissue swelling resolution.  Unfortunately she had a protracted course for her swelling to resolve.  Ultimately taken to the operating room on 02/11/2021 for definitive fixation of her left pilon fracture.  Patient was taken to the OR and admitted above.  She tolerated  procedure well.  She was admitted overnight for observation Casey control and therapy.  Patient was stable for discharge following morning to remain nonweightbearing for another 4 to 6 weeks.  He is splinted for 2 weeks and then will be placed into a removable boot for range of motion exercises.   Consults: None  Significant Diagnostic Studies: labs:   Results for SPARKLES, MCNEELY (MRN [id number]) as of 03/03/2021 18:44  Ref. Range 02/11/2021 18:19 02/11/2021 18:42 02/12/2021 01:40  BASIC METABOLIC PANEL Unknown   Rpt (A)  Sodium Latest Ref Range: 135 - 145 mmol/L   138  Potassium Latest Ref Range: 3.5 - 5.1 mmol/L   3.8  Chloride Latest Ref Range: 98 - 111 mmol/L   105  CO2 Latest Ref Range: 22 - 32 mmol/L   26  Glucose Latest Ref Range: 70 - 99 mg/dL   784 (H)  BUN Latest Ref Range: 6 - 20 mg/dL   11  Creatinine Latest Ref Range: 0.44 - 1.00 mg/dL   6.96  Calcium Latest Ref Range: 8.9 - 10.3 mg/dL   9.0  Anion gap Latest Ref Range: 5 - 15    7  GFR, Estimated Latest Ref Range: >60 mL/min   >60  WBC Latest Ref Range: 4.0 - 10.5 K/uL   12.7 (H)  RBC Latest Ref Range: 3.87 - 5.11 MIL/uL   3.90  Hemoglobin Latest Ref Range: 12.0 - 15.0 g/dL   29.5  HCT Latest Ref Range: 36.0 - 46.0 %   36.1  MCV Latest Ref Range: 80.0 - 100.0 fL  92.6  MCH Latest Ref Range: 26.0 - 34.0 pg   31.5  MCHC Latest Ref Range: 30.0 - 36.0 g/dL   16.1  RDW Latest Ref Range: 11.5 - 15.5 %   12.4  Platelets Latest Ref Range: 150 - 400 K/uL   259  nRBC Latest Ref Range: 0.0 - 0.2 %   0.0  Please Note (HCV): Unknown  Comment   Hep A Ab, IgM Latest Ref Range: NON REACTIVE   NON REACTIVE   Hepatitis B Surface Ag Latest Ref Range: NON REACTIVE   NON REACTIVE   Hep B Core Ab, IgM Latest Ref Range: NON REACTIVE   NON REACTIVE   Test Information Unknown  Comment   Hcv Genotype Unknown  Comment   HCV Ab Latest Ref Range: NON REACTIVE   Reactive (A)   HEPATITIS C GENOTYPE Unknown  2b   Hepatitis C Quantitation Latest  Units: IU/mL  575,000   HCV RNA Qnt(log copy/mL) Latest Units: log10 IU/mL  5.760    Treatments: IV hydration, antibiotics: Ancef, analgesia: acetaminophen, Dilaudid and percocet, anticoagulation: eliquis, therapies: PT, OT and RN and surgery: as above  Discharge Exam:    Orthopaedic Trauma Service Progress Note   Patient ID: MASEN SALVAS MRN: [id number] DOB/AGE: [birth year] 40 y.o.   Subjective:   Doing ok  Block wore off early this am, Casey pretty severe at the moment No other complaints    No CP or SOB, no N/V   ROS As above   Objective:    VITALS:         Vitals:    02/11/21 1833 02/11/21 2056 02/11/21 2333 02/12/21 0422  BP: 126/63 124/61 133/67 (!) 154/93  Pulse: 72 85 76 74  Resp:   17 18 18   Temp: 97.6 F (36.4 C) 98.5 F (36.9 C) 98.2 F (36.8 C) 98 F (36.7 C)  TempSrc: Oral Oral Oral    SpO2: 100% 97% 98% 98%  Weight:          Height:              Estimated body mass index is 30.9 kg/m as calculated from the following:   Height as of this encounter: 5\' 4"  (1.626 m).   Weight as of this encounter: 81.6 kg.     Intake/Output      05/19 0701 05/20 0700 05/20 0701 05/21 0700   I.V. (mL/kg) 1344.9 (16.5)    IV Piggyback 173.5    Total Intake(mL/kg) 1518.4 (18.6)    Urine (mL/kg/hr) 0    Blood 200    Total Output 200    Net +1318.4         Urine Occurrence 3 x       LABS   Lab Results Last 24 Hours       Results for orders placed or performed during the hospital encounter of 02/11/21 (from the past 24 hour(s))  SARS Coronavirus 2 by RT PCR (hospital order, performed in [facility] hospital lab) Nasopharyngeal Nasopharyngeal Swab     Status: None    Collection Time: 02/11/21 11:38 AM    Specimen: Nasopharyngeal Swab  Result Value Ref Range    SARS Coronavirus 2 NEGATIVE NEGATIVE  CBC WITH DIFFERENTIAL     Status: Abnormal    Collection Time: 02/11/21 11:40 AM  Result Value Ref Range    WBC 11.7 (H) 4.0 - 10.5 K/uL    RBC 4.66 3.87 -  5.11 MIL/uL    Hemoglobin 14.4 12.0 -  15.0 g/dL    HCT 16.143.7 09.636.0 - 04.546.0 %    MCV 93.8 80.0 - 100.0 fL    MCH 30.9 26.0 - 34.0 pg    MCHC 33.0 30.0 - 36.0 g/dL    RDW 40.912.5 81.111.5 - 91.415.5 %    Platelets 290 150 - 400 K/uL    nRBC 0.0 0.0 - 0.2 %    Neutrophils Relative % 75 %    Neutro Abs 8.9 (H) 1.7 - 7.7 K/uL    Lymphocytes Relative 18 %    Lymphs Abs 2.1 0.7 - 4.0 K/uL    Monocytes Relative 5 %    Monocytes Absolute 0.6 0.1 - 1.0 K/uL    Eosinophils Relative 1 %    Eosinophils Absolute 0.1 0.0 - 0.5 K/uL    Basophils Relative 1 %    Basophils Absolute 0.1 0.0 - 0.1 K/uL    Immature Granulocytes 0 %    Abs Immature Granulocytes 0.03 0.00 - 0.07 K/uL  Comprehensive metabolic panel     Status: Abnormal    Collection Time: 02/11/21 11:40 AM  Result Value Ref Range    Sodium 139 135 - 145 mmol/L    Potassium 3.4 (L) 3.5 - 5.1 mmol/L    Chloride 104 98 - 111 mmol/L    CO2 27 22 - 32 mmol/L    Glucose, Bld 88 70 - 99 mg/dL    BUN 8 6 - 20 mg/dL    Creatinine, Ser 7.820.71 0.44 - 1.00 mg/dL    Calcium 9.6 8.9 - 95.610.3 mg/dL    Total Protein 7.2 6.5 - 8.1 g/dL    Albumin 4.3 3.5 - 5.0 g/dL    AST 21 15 - 41 U/L    ALT 21 0 - 44 U/L    Alkaline Phosphatase 58 38 - 126 U/L    Total Bilirubin 0.5 0.3 - 1.2 mg/dL    GFR, Estimated >21>60 >30>60 mL/min    Anion gap 8 5 - 15  Protime-INR     Status: None    Collection Time: 02/11/21 11:40 AM  Result Value Ref Range    Prothrombin Time 12.9 11.4 - 15.2 seconds    INR 1.0 0.8 - 1.2  Surgical pcr screen     Status: None    Collection Time: 02/11/21 11:47 AM    Specimen: Nasal Mucosa; Nasal Swab  Result Value Ref Range    MRSA, PCR NEGATIVE NEGATIVE    Staphylococcus aureus NEGATIVE NEGATIVE  Pregnancy, urine POC     Status: None    Collection Time: 02/11/21 12:37 PM  Result Value Ref Range    Preg Test, Ur NEGATIVE NEGATIVE  Hepatitis panel, acute     Status: Abnormal    Collection Time: 02/11/21  6:42 PM  Result Value Ref Range     Hepatitis B Surface Ag NON REACTIVE NON REACTIVE    HCV Ab Reactive (A) NON REACTIVE    Hep A IgM NON REACTIVE NON REACTIVE    Hep B C IgM NON REACTIVE NON REACTIVE  CBC     Status: None    Collection Time: 02/11/21  6:42 PM  Result Value Ref Range    WBC 7.7 4.0 - 10.5 K/uL    RBC 4.27 3.87 - 5.11 MIL/uL    Hemoglobin 13.4 12.0 - 15.0 g/dL    HCT 86.539.9 78.436.0 - 69.646.0 %    MCV 93.4 80.0 - 100.0 fL    MCH 31.4 26.0 - 34.0 pg  MCHC 33.6 30.0 - 36.0 g/dL    RDW 16.1 09.6 - 04.5 %    Platelets 253 150 - 400 K/uL    nRBC 0.0 0.0 - 0.2 %  Creatinine, serum     Status: None    Collection Time: 02/11/21  6:42 PM  Result Value Ref Range    Creatinine, Ser 0.88 0.44 - 1.00 mg/dL    GFR, Estimated >40 >98 mL/min  CBC     Status: Abnormal    Collection Time: 02/12/21  1:40 AM  Result Value Ref Range    WBC 12.7 (H) 4.0 - 10.5 K/uL    RBC 3.90 3.87 - 5.11 MIL/uL    Hemoglobin 12.3 12.0 - 15.0 g/dL    HCT 11.9 14.7 - 82.9 %    MCV 92.6 80.0 - 100.0 fL    MCH 31.5 26.0 - 34.0 pg    MCHC 34.1 30.0 - 36.0 g/dL    RDW 56.2 13.0 - 86.5 %    Platelets 259 150 - 400 K/uL    nRBC 0.0 0.0 - 0.2 %  Basic metabolic panel     Status: Abnormal    Collection Time: 02/12/21  1:40 AM  Result Value Ref Range    Sodium 138 135 - 145 mmol/L    Potassium 3.8 3.5 - 5.1 mmol/L    Chloride 105 98 - 111 mmol/L    CO2 26 22 - 32 mmol/L    Glucose, Bld 180 (H) 70 - 99 mg/dL    BUN 11 6 - 20 mg/dL    Creatinine, Ser 7.84 0.44 - 1.00 mg/dL    Calcium 9.0 8.9 - 69.6 mg/dL    GFR, Estimated >29 >52 mL/min    Anion gap 7 5 - 15          PHYSICAL EXAM:    Gen: NAD, appears well Lungs: unlabored Cardiac: RRR Ext:       Left Lower Extremity              Splint c/d/i             Ext warm              Motor and sensory functions intact             + DP pulse             No Casey out of proportion with passive stretching              Swelling stable      Assessment/Plan: 1 Day Post-Op    Principal  Problem:   Closed left pilon fracture, initial encounter Active Problems:   Depression   Hepatitis C test positive   Hypertension   Schizophrenia (HCC)   GERD (gastroesophageal reflux disease)   Vitamin D deficiency   Nicotine dependence   History of substance abuse (HCC)                Anti-infectives (From admission, onward)      Start     Dose/Rate Route Frequency Ordered Stop    02/11/21 2030   ceFAZolin (ANCEF) IVPB 1 g/50 mL premix        1 g 100 mL/hr over 30 Minutes Intravenous Every 6 hours 02/11/21 1835 02/12/21 1429    02/11/21 1145   ceFAZolin (ANCEF) IVPB 2g/100 mL premix        2 g 200 mL/hr over 30 Minutes Intravenous On call to O.R. 02/11/21 1140 02/11/21  1435    02/11/21 1145   ceFAZolin (ANCEF) 2-4 GM/100ML-% IVPB       Note to Pharmacy: Amanda Pea   : cabinet override         02/11/21 1145 02/11/21 1421       .   POD/HD#: 1   40 y/o female s/p delayed ORIF L pilon fracture, tibia only    - Left pilon fracture s/p delayed ORIF due to soft tissue swelling              NWB L leg x 6 weeks             Splint x 2 weeks then convert to CAM             PT/OT             Ice and elevate              Plan for dc home tomorrow   - Casey management:             Multimodal   - ABL anemia/Hemodynamics             Stable   - DVT/PE prophylaxis:             eliquis 2.5 mg po BID x 4 weeks              - ID:              Ancef for periop coverage   - Metabolic Bone Disease:             Vitamin d deficiency                          Supplement      - FEN/GI prophylaxis/Foley/Lines:             Reg diet    -Ex-fix/Splint care:             Keep splint clean and dry               - Impediments to fracture healing:             Nicotine use             Vitamin d deficiency    - Dispo:             Therapy evals             Home tomorrow              meds to be sent to pharmacy in [place]    Disposition: Discharge disposition: 01-Home or Self  Care       Discharge Instructions    Call MD / Call 911   Complete by: As directed    If you experience chest Casey or shortness of breath, CALL 911 and be transported to the hospital emergency room.  If you develope a fever above 101 F, pus (white drainage) or increased drainage or redness at the wound, or calf Casey, call your surgeon's office.   Constipation Prevention   Complete by: As directed    Drink plenty of fluids.  Prune juice may be helpful.  You may use a stool softener, such as Colace (over the counter) 100 mg twice a day.  Use MiraLax (over the counter) for constipation as needed.   Diet general   Complete by: As directed    Discharge instructions  Complete by: As directed    Orthopaedic Trauma Service Discharge Instructions   General Discharge Instructions  Orthopaedic Injuries:  Left distal tibia and fibula fracture treated with open reduction and internal fixation with plate and screws   WEIGHT BEARING STATUS: Nonweightbearing Left leg. Use walker or crutches   RANGE OF MOTION/ACTIVITY: ok to move toes and knee.  Do not remove splint   Bone health: continue with vitamin D  Wound Care: do not remove splint. Do not get splint wet   DVT/PE prophylaxis: eliquis 2.5 mg 2x a day x 4 weeks   Diet: as you were eating previously.  Can use over the counter stool softeners and bowel preparations, such as Miralax, to help with bowel movements.  Narcotics can be constipating.  Be sure to drink plenty of fluids  Casey MEDICATION USE AND EXPECTATIONS  You have likely been given narcotic medications to help control your Casey.  After a traumatic event that results in an fracture (broken bone) with or without surgery, it is ok to use narcotic Casey medications to help control one's Casey.  We understand that everyone responds to Casey differently and each individual patient will be evaluated on a regular basis for the continued need for narcotic medications. Ideally, narcotic  medication use should last no more than 6-8 weeks (coinciding with fracture healing).   As a patient it is your responsibility as well to monitor narcotic medication use and report the amount and frequency you use these medications when you come to your office visit.   We would also advise that if you are using narcotic medications, you should take a dose prior to therapy to maximize you participation.  IF YOU ARE ON NARCOTIC MEDICATIONS IT IS NOT PERMISSIBLE TO OPERATE A MOTOR VEHICLE (MOTORCYCLE/CAR/TRUCK/MOPED) OR HEAVY MACHINERY DO NOT MIX NARCOTICS WITH OTHER CNS (CENTRAL NERVOUS SYSTEM) DEPRESSANTS SUCH AS ALCOHOL   POST-OPERATIVE OPIOID TAPER INSTRUCTIONS: It is important to wean off of your opioid medication as soon as possible. If you do not need Casey medication after your surgery it is ok to stop day one. Opioids include: Codeine, Hydrocodone(Norco, Vicodin), Oxycodone(Percocet, oxycontin) and hydromorphone amongst others.  Long term and even short term use of opiods can cause: Increased Casey response Dependence Constipation Depression Respiratory depression And more.  Withdrawal symptoms can include Flu like symptoms Nausea, vomiting And more Techniques to manage these symptoms Hydrate well Eat regular healthy meals Stay active Use relaxation techniques(deep breathing, meditating, yoga) Do Not substitute Alcohol to help with tapering If you have been on opioids for less than two weeks and do not have Casey than it is ok to stop all together.  Plan to wean off of opioids This plan should start within one week post op of your fracture surgery  Maintain the same interval or time between taking each dose and first decrease the dose.  Cut the total daily intake of opioids by one tablet each day Next start to increase the time between doses. The last dose that should be eliminated is the evening dose.    STOP SMOKING OR USING NICOTINE PRODUCTS!!!!  As discussed nicotine  severely impairs your body's ability to heal surgical and traumatic wounds but also impairs bone healing.  Wounds and bone heal by forming microscopic blood vessels (angiogenesis) and nicotine is a vasoconstrictor (essentially, shrinks blood vessels).  Therefore, if vasoconstriction occurs to these microscopic blood vessels they essentially disappear and are unable to deliver necessary nutrients to the healing tissue.  This is  one modifiable factor that you can do to dramatically increase your chances of healing your injury.    (This means no smoking, no nicotine gum, patches, etc)  DO NOT USE NONSTEROIDAL ANTI-INFLAMMATORY DRUGS (NSAID'S)  Using products such as Advil (ibuprofen), Aleve (naproxen), Motrin (ibuprofen) for additional Casey control during fracture healing can delay and/or prevent the healing response.  If you would like to take over the counter (OTC) medication, Tylenol (acetaminophen) is ok.  However, some narcotic medications that are given for Casey control contain acetaminophen as well. Therefore, you should not exceed more than 4000 mg of tylenol in a day if you do not have liver disease.  Also note that there are may OTC medicines, such as cold medicines and allergy medicines that my contain tylenol as well.  If you have any questions about medications and/or interactions please ask your doctor/PA or your pharmacist.      ICE AND ELEVATE INJURED/OPERATIVE EXTREMITY  Using ice and elevating the injured extremity above your heart can help with swelling and Casey control.  Icing in a pulsatile fashion, such as 20 minutes on and 20 minutes off, can be followed.    Do not place ice directly on skin. Make sure there is a barrier between to skin and the ice pack.    Using frozen items such as frozen peas works well as the conform nicely to the are that needs to be iced.  USE AN ACE WRAP OR TED HOSE FOR SWELLING CONTROL  In addition to icing and elevation, Ace wraps or TED hose are used to  help limit and resolve swelling.  It is recommended to use Ace wraps or TED hose until you are informed to stop.    When using Ace Wraps start the wrapping distally (farthest away from the body) and wrap proximally (closer to the body)   Example: If you had surgery on your leg or thing and you do not have a splint on, start the ace wrap at the toes and work your way up to the thigh        If you had surgery on your upper extremity and do not have a splint on, start the ace wrap at your fingers and work your way up to the upper arm  IF YOU ARE IN A SPLINT OR CAST DO NOT REMOVE IT FOR ANY REASON   If your splint gets wet for any reason please contact the office immediately. You may shower in your splint or cast as long as you keep it dry.  This can be done by wrapping in a cast cover or garbage back (or similar)  Do Not stick any thing down your splint or cast such as pencils, money, or hangers to try and scratch yourself with.  If you feel itchy take benadryl as prescribed on the bottle for itching  IF YOU ARE IN A CAM BOOT (BLACK BOOT)  You may remove boot periodically. Perform daily dressing changes as noted below.  Wash the liner of the boot regularly and wear a sock when wearing the boot. It is recommended that you sleep in the boot until told otherwise    Call office for the following: Temperature greater than 101F Persistent nausea and vomiting Severe uncontrolled Casey Redness, tenderness, or signs of infection (Casey, swelling, redness, odor or green/yellow discharge around the site) Difficulty breathing, headache or visual disturbances Hives Persistent dizziness or light-headedness Extreme fatigue Any other questions or concerns you may have after discharge  In  an emergency, call 911 or go to an Emergency Department at a nearby hospital  HELPFUL INFORMATION  If you had a block, it will wear off between 8-24 hrs postop typically.  This is period when your Casey may go from nearly  zero to the Casey you would have had postop without the block.  This is an abrupt transition but nothing dangerous is happening.  You may take an extra dose of narcotic when this happens.  You should wean off your narcotic medicines as soon as you are able.  Most patients will be off or using minimal narcotics before their first postop appointment.   We suggest you use the Casey medication the first night prior to going to bed, in order to ease any Casey when the anesthesia wears off. You should avoid taking Casey medications on an empty stomach as it will make you nauseous.  Do not drink alcoholic beverages or take illicit drugs when taking Casey medications.  In most states it is against the law to drive while you are in a splint or sling.  And certainly against the law to drive while taking narcotics.  You may return to work/school in the next couple of days when you feel up to it.   Casey medication may make you constipated.  Below are a few solutions to try in this order: Decrease the amount of Casey medication if you aren't having Casey. Drink lots of decaffeinated fluids. Drink prune juice and/or each dried prunes  If the first 3 don't work start with additional solutions Take Colace - an over-the-counter stool softener Take Senokot - an over-the-counter laxative Take Miralax - a stronger over-the-counter laxative     CALL THE OFFICE WITH ANY QUESTIONS OR CONCERNS: [phone]   VISIT OUR WEBSITE FOR ADDITIONAL INFORMATION: [email or web address]   Driving restrictions   Complete by: As directed    No driving for 9 weeks   Increase activity slowly as tolerated   Complete by: As directed    Non weight bearing   Complete by: As directed    Laterality: left   Extremity: Lower   Post-operative opioid taper instructions:   Complete by: As directed    POST-OPERATIVE OPIOID TAPER INSTRUCTIONS: It is important to wean off of your opioid medication as soon as possible. If you do not need  Casey medication after your surgery it is ok to stop day one. Opioids include: Codeine, Hydrocodone(Norco, Vicodin), Oxycodone(Percocet, oxycontin) and hydromorphone amongst others.  Long term and even short term use of opiods can cause: Increased Casey response Dependence Constipation Depression Respiratory depression And more.  Withdrawal symptoms can include Flu like symptoms Nausea, vomiting And more Techniques to manage these symptoms Hydrate well Eat regular healthy meals Stay active Use relaxation techniques(deep breathing, meditating, yoga) Do Not substitute Alcohol to help with tapering If you have been on opioids for less than two weeks and do not have Casey than it is ok to stop all together.  Plan to wean off of opioids This plan should start within one week post op of your joint replacement. Maintain the same interval or time between taking each dose and first decrease the dose.  Cut the total daily intake of opioids by one tablet each day Next start to increase the time between doses. The last dose that should be eliminated is the evening dose.        Allergies as of 02/12/2021      Reactions   Penicillins Nausea And Vomiting, Rash  CEPHALOSPORIN TOLERANT 12/31/20 Reaction: Childhood & 2005   Pregabalin    Other reaction(s): Altered mental status, Hallucinations   Codeine    Other reaction(s): nausea/vomitting      Medication List    STOP taking these medications   Xarelto 10 MG Tabs tablet Generic drug: rivaroxaban     TAKE these medications   acetaminophen 325 MG tablet Commonly known as: TYLENOL Take 2 tablets (650 mg total) by mouth every 12 (twelve) hours.   amphetamine-dextroamphetamine 5 MG tablet Commonly known as: ADDERALL Take 5 mg by mouth daily.   apixaban 2.5 MG Tabs tablet Commonly known as: ELIQUIS Take 1 tablet (2.5 mg total) by mouth 2 (two) times daily.   diclofenac Sodium 1 % Gel Commonly known as: VOLTAREN Apply 1  application topically daily as needed for Casey.   docusate sodium 100 MG capsule Commonly known as: COLACE Take 1 capsule (100 mg total) by mouth 2 (two) times daily. What changed: when to take this   gabapentin 300 MG capsule Commonly known as: NEURONTIN Take 1 capsule (300 mg total) by mouth 2 (two) times daily. What changed:   how much to take  when to take this   methocarbamol 500 MG tablet Commonly known as: ROBAXIN Take 1-2 tablets (500-1,000 mg total) by mouth every 8 (eight) hours as needed for muscle spasms.   Natural Vitamin D-3 125 MCG (5000 UT) Tabs Generic drug: Cholecalciferol Take by mouth daily. What changed: how much to take   oxyCODONE-acetaminophen 7.5-325 MG tablet Commonly known as: PERCOCET Take 1-2 tablets by mouth every 8 (eight) hours as needed for moderate Casey or severe Casey.   vitamin C 1000 MG tablet Take 1 tablet (1,000 mg total) by mouth daily.            Discharge Care Instructions  (From admission, onward)         Start     Ordered   02/12/21 0000  Non weight bearing       Question Answer Comment  Laterality left   Extremity Lower      02/12/21 [id number]          Follow-up Information    Myrene Galas, MD Follow up.   Specialty: Orthopedic Surgery Contact information: [address] [place] [address] [phone]               Discharge Instructions and Plan:  40 year old female ORIF left pilon fracture with removal of external fixator  Weightbearing: NWB LLE Insicional and dressing care: Dressings left intact until follow-up Orthopedic device(s): Splint Showering: Okay to shower but keep splint clean and dry VTE prophylaxis: Eliquis 2.5 mg twice daily x 4 weeks . Casey control: Multimodal Bone Health/Optimization: Continue with vitamin D supplementation that we have reviewed previously Follow - up plan: 2 weeks Contact information: Myrene Galas MD, Montez Morita PA-C  Signed:  Mearl Latin,  PA-C [phone] (C) 03/03/2021, 6:41 PM  [facility] [address] [place] [address] [phone] Collier Bullock (F)

## 2021-03-06 NOTE — Op Note (Signed)
02/11/2021  11:53 AM  PATIENT:  Darlene Casey  10-23-1980 female   MEDICAL RECORD NUMBER: [id number]  PRE-OPERATIVE DIAGNOSIS:   1. LEFT ANKLE PILON FRACTURE, TIBIA AND FIBULA 2. RETAINED EXTERNALFIXATOR LEFT ANKLE 3. ULCERATED PIN SITES LEG AND HEEL  POST-OPERATIVE DIAGNOSIS:   1. LEFT ANKLE PILON FRACTURE, TIBIA AND FIBULA 2. RETAINED EXTERNALFIXATOR LEFT ANKLE 3. ULCERATED PIN SITES LEG AND HEEL  PROCEDURE:  Procedure(s): 1. OPEN REDUCTION INTERNAL FIXATION OF LEFT ANKLE PILON FRACTURE, TIBIA ONLY 2. REMOVAL EXTERNAL FIXATION  LEFT LEG (Left) 3. DEBRIDEMENT OF ULCERATED PIN SITES INCLUDING BONE 4. MANUAL APPLICATION OF STRESS UNDER FLUOROSCOPY LEFT ANKLE SYNDESMOSIS  SURGEON:  Surgeon(s) and Role:    Myrene Galas, MD - Primary  PHYSICIAN ASSISTANT: 1. KEITH PAUL, PA-C; 2. PA Student  ANESTHESIA:   general  EBL:  100 mL   BLOOD ADMINISTERED:none  DRAINS: none   LOCAL MEDICATIONS USED:  NONE  SPECIMEN:  No Specimen  DISPOSITION OF SPECIMEN:  N/A  COUNTS:  YES  TOURNIQUET: None  DICTATION: Note written in EPIC  PLAN OF CARE: Discharge to home after PACU  PATIENT DISPOSITION:  PACU - hemodynamically stable.   Delay start of Pharmacological VTE agent (>24hrs) due to surgical blood loss or risk of bleeding: no  INDICATION FOR PROCEDURE: Darlene Casey is a 40 y.o. who sustained a displaced and shortened pilon fracture treated initially and provisionally with external fixation to restore length and alignment while waiting for adequate soft tissue swelling resolution now six weeks out from injury.  I discussed with the patient the risks and benefits of surgery, including the possibility of infection, nerve injury, vessel injury, wound breakdown, arthritis, symptomatic hardware, DVT/ PE, loss of motion, malunion, nonunion, and need for further surgery among others.  We also specifically discussed the elevated risk of soft tissue breakdown that could lead to  amputation.  Furthermore, we addressed the syndesmosis ligament, which would be assessed following fracture repair. If unstable and approachable through the soft tissue envelop, this would require additional fixation and that some of this fixation may require later removal. These risks were acknowledged and consent provided to proceed.  SUMMARY OF PROCEDURE:  The patient was taken to the operating room after administration of preoperative antibiotics.  General anesthesia was induced.  the external fixator was retained for the standard prep and drape. The left lower extremity was cleaned with chlorhexidine soap and scrub and then followed by Betadine scrub and paint. A time out was held. The fixator clamps were then loosened and the pins removed, followed by a thorough scrubbing with chlorhexidine and wash. The pin sites, which had ulcerated at the leg and heel around the pins and to a lesser extent at the metatarsals, were then debrided with curettes at the skin, subcutaneous tissues, muscle fascia layers, as well as the near and far bone cortices, removing all discernible devitalized necrotic tissue, bone debris, and desiccated fibrinous material. The calcaneal pin tract was debrided with curettage all the way through the bone canal to the opposite side of the calcaneus. This canal and all pin sites were thoroughly irrigated thoroughly with saline. The pins sites were protected from the field with ioban. Fresh gloves were applied by operative staff.    Because we had wrinkling in the anticipated area of incision, we felt that it was most prudent to proceed with direct fixation using a limited dissection and a midline incision. Dissection was carried carefully down where the anterior compartment was released, and  the anterior tib swept laterally.  The fracture lines were identified and cleaned with curette and lavage while preserving periosteal attachments.  A reduction maneuver with the large tenaculum  was then performed of the lateral aspect of the plafond.  This was compressed and an anterior-to-posterior lag screw placed to secure it. The clamp was then removed and attention turned to the anteromedial fragment.  The sharp tenaculum clamp was used again achieving compression, this was pinned provisionally and then, the anterolateral plate used to secure fixation with standard conical screws and a lag technique anterior to posterior on the medial side, followed by additional standard conical screw anterior to posterior on the lateral side of the plafond or articular segment.  Once this was done, we then tightened the screw in the sliding hole achieving compression across this area.  All fragments interdigitated and appeared to be under compression. Locking screws were then placed within two central screw plate holes distally.  I then placed additional standard fixation in the proximal fragment with conical screws and then 1 lock screw because the fracture involved both medial and lateral aspects distally.  C-arm was brought in to confirm accepatable reduction and hardware placement.  My assistant, Montez Morita, PAC, retracted the nerve and vessel to protect it throughout and also helped to place provisional fixation and produced compression while it was tightening definitive fixation.  Once the C-arm was in place, AP, lateral, and mortise views were obtained, and then, a stress view was performed in which we did not identify any lateral subluxation of the talus, syndesmotic widening, or increase in the medial clear space. The soft tissue swelling laterally remained severe enough that a separate approach could not have been risked here at this juncture regardless. The wounds were irrigated thoroughly and closed in standard layered fashion using 2-0 Vicryl and 3-0 nylon.  Sterile gently compressive dressing was applied and then, a posterior and stirrup splint.  The patient was awakened from  anesthesia and transported to PACU in stable condition.  Again, Montez Morita, PAC, did assist me throughout.   PROGNOSIS: Darlene Casey is at increased risk for infection, nonunion, delayed union, and soft tissue complications because of degree of injury and past social history. PT will assist with nonweightbearing for the next 6-8 weeks with protected graduated weightbearing thereafter and will also be on formal pharmacologic DVT prophylaxis.

## 2021-06-25 ENCOUNTER — Ambulatory Visit: Payer: Medicare HMO | Admitting: Sports Medicine

## 2021-07-09 ENCOUNTER — Ambulatory Visit: Payer: Medicare HMO | Admitting: Sports Medicine

## 2021-07-21 ENCOUNTER — Ambulatory Visit: Payer: Medicare HMO

## 2021-07-21 ENCOUNTER — Ambulatory Visit (INDEPENDENT_AMBULATORY_CARE_PROVIDER_SITE_OTHER): Payer: Medicare HMO | Admitting: Sports Medicine

## 2021-07-21 ENCOUNTER — Other Ambulatory Visit: Payer: Self-pay

## 2021-07-21 DIAGNOSIS — D518 Other vitamin B12 deficiency anemias: Secondary | ICD-10-CM | POA: Insufficient documentation

## 2021-07-21 DIAGNOSIS — N920 Excessive and frequent menstruation with regular cycle: Secondary | ICD-10-CM | POA: Insufficient documentation

## 2021-07-21 DIAGNOSIS — M79605 Pain in left leg: Secondary | ICD-10-CM

## 2021-07-21 DIAGNOSIS — M25572 Pain in left ankle and joints of left foot: Secondary | ICD-10-CM | POA: Diagnosis not present

## 2021-07-21 DIAGNOSIS — E039 Hypothyroidism, unspecified: Secondary | ICD-10-CM | POA: Insufficient documentation

## 2021-07-21 DIAGNOSIS — S82402A Unspecified fracture of shaft of left fibula, initial encounter for closed fracture: Secondary | ICD-10-CM | POA: Diagnosis not present

## 2021-07-21 DIAGNOSIS — F419 Anxiety disorder, unspecified: Secondary | ICD-10-CM | POA: Insufficient documentation

## 2021-07-21 DIAGNOSIS — M797 Fibromyalgia: Secondary | ICD-10-CM | POA: Insufficient documentation

## 2021-07-21 DIAGNOSIS — R41840 Attention and concentration deficit: Secondary | ICD-10-CM | POA: Insufficient documentation

## 2021-07-21 DIAGNOSIS — S82872A Displaced pilon fracture of left tibia, initial encounter for closed fracture: Secondary | ICD-10-CM

## 2021-07-21 DIAGNOSIS — N926 Irregular menstruation, unspecified: Secondary | ICD-10-CM | POA: Insufficient documentation

## 2021-07-21 DIAGNOSIS — E782 Mixed hyperlipidemia: Secondary | ICD-10-CM | POA: Insufficient documentation

## 2021-07-21 DIAGNOSIS — G2581 Restless legs syndrome: Secondary | ICD-10-CM | POA: Insufficient documentation

## 2021-07-21 DIAGNOSIS — E1165 Type 2 diabetes mellitus with hyperglycemia: Secondary | ICD-10-CM | POA: Insufficient documentation

## 2021-07-21 DIAGNOSIS — M150 Primary generalized (osteo)arthritis: Secondary | ICD-10-CM | POA: Insufficient documentation

## 2021-07-21 DIAGNOSIS — M216X2 Other acquired deformities of left foot: Secondary | ICD-10-CM

## 2021-07-21 DIAGNOSIS — K2100 Gastro-esophageal reflux disease with esophagitis, without bleeding: Secondary | ICD-10-CM | POA: Insufficient documentation

## 2021-07-21 MED ORDER — TRAMADOL HCL 50 MG PO TABS: 50.0000 mg | ORAL_TABLET | Freq: Three times a day (TID) | ORAL | 0 refills | Status: AC | PRN

## 2021-07-21 MED ORDER — LIDOCAINE 5 % EX PTCH: 1.0000 | MEDICATED_PATCH | CUTANEOUS | 0 refills | Status: AC

## 2021-07-21 NOTE — Progress Notes (Signed)
Subjective: Darlene Casey is a 40 y.o. female patient who presents to office for evaluation of left foot and ankle pain.  Patient had traumatic injury on 01/03/2021 and underwent placement of external fixator and then later had secondary surgery to help with the pilon fracture with internal fixation 01/2021 with Dr. Carola Frost.  Patient reports that she saw her doctor again and he wants to do a third surgery to lengthen out her Achilles tendon but she does not want to have surgery.  Patient reports that she still struggles with a lot of pain swelling aching can barely walk or stand at times because the pain is so bad and she does not want to go through another surgery and wants to see if there are any other options and is here for a second opinion.  Patient states that after her surgery she has not tried anything including physical therapy states that she is interested in maybe trying this first before another procedure or either a special insole or brace.  Patient denies any other symptoms at this time.  Patient Active Problem List   Diagnosis Date Noted   Anxiety disorder 07/21/2021   Attention and concentration deficit 07/21/2021   Excessive and frequent menstruation 07/21/2021   Fibromyalgia 07/21/2021   Gastro-esophageal reflux disease with esophagitis 07/21/2021   Hyperglycemia due to type 2 diabetes mellitus (HCC) 07/21/2021   Hypothyroidism 07/21/2021   Irregular menstruation 07/21/2021   Mixed hyperlipidemia 07/21/2021   Other vitamin B12 deficiency anemias 07/21/2021   Primary generalized (osteo)arthritis 07/21/2021   Restless legs syndrome 07/21/2021   Closed left fibular fracture 01/01/2021   Vitamin D deficiency 01/01/2021   Nicotine dependence 01/01/2021   History of substance abuse (HCC) 01/01/2021   Hepatitis C test positive    Hypertension    Schizophrenia (HCC)    GERD (gastroesophageal reflux disease)    Leg fracture 12/31/2020   Closed left pilon fracture, initial encounter  12/31/2020   Influenza A 11/10/2017   Marijuana abuse 11/10/2017   Depression 12/15/2008   CELLULITIS AND ABSCESS OF UPPER ARM AND FOREARM 12/15/2008   FIBROMYALGIA 12/15/2008   RASH AND OTHER NONSPECIFIC SKIN ERUPTION 12/15/2008   OTH COMPS DUE OTH INTRL PROSTH DEVICE IMPL&GFT 12/15/2008    Current Outpatient Medications on File Prior to Visit  Medication Sig Dispense Refill   oxyCODONE (OXY IR/ROXICODONE) 5 MG immediate release tablet 1 tablet     acetaminophen (TYLENOL) 325 MG tablet Take 2 tablets (650 mg total) by mouth every 12 (twelve) hours. 60 tablet 0   amphetamine-dextroamphetamine (ADDERALL) 5 MG tablet Take 5 mg by mouth daily.     apixaban (ELIQUIS) 2.5 MG TABS tablet Take 1 tablet (2.5 mg total) by mouth 2 (two) times daily. 60 tablet 0   ascorbic acid (VITAMIN C) 1000 MG tablet Take 1 tablet (1,000 mg total) by mouth daily. 30 tablet 1   Cholecalciferol 125 MCG (5000 UT) TABS Take by mouth daily. (Patient taking differently: Take 5,000 Units by mouth daily.) 30 tablet 5   diclofenac Sodium (VOLTAREN) 1 % GEL Apply 1 application topically daily as needed for pain.     docusate sodium (COLACE) 100 MG capsule Take 1 capsule (100 mg total) by mouth 2 (two) times daily. (Patient taking differently: Take 100 mg by mouth daily.) 30 capsule 0   gabapentin (NEURONTIN) 300 MG capsule Take 1 capsule (300 mg total) by mouth 2 (two) times daily. (Patient taking differently: Take 300-600 mg by mouth every 8 (eight) hours.) 60  capsule 0   gabapentin (NEURONTIN) 300 MG capsule 2 capsule     methocarbamol (ROBAXIN) 500 MG tablet Take 1-2 tablets (500-1,000 mg total) by mouth every 8 (eight) hours as needed for muscle spasms. 60 tablet 0   methocarbamol (ROBAXIN) 500 MG tablet 1 tablet     oxyCODONE-acetaminophen (PERCOCET) 7.5-325 MG tablet Take 1-2 tablets by mouth every 8 (eight) hours as needed for moderate pain or severe pain. 60 tablet 0   No current facility-administered medications on  file prior to visit.    Allergies  Allergen Reactions   Penicillins Nausea And Vomiting and Rash    CEPHALOSPORIN TOLERANT 12/31/20 Reaction: Childhood & 2005   Pregabalin     Other reaction(s): Altered mental status, Hallucinations   Codeine     Other reaction(s): nausea/vomitting    Objective:  General: Alert and oriented x3 in no acute distress  Dermatology: Surgical scar well-healed at left foot and ankle.  No open lesions bilateral lower extremities, no webspace macerations, no ecchymosis bilateral, all nails x 10 are well manicured.  Vascular: Dorsalis Pedis and Posterior Tibial pedal pulses palpable 1 out of 4 however there is trace edema to the ankle and foot on the left likely related to extensive trauma and previous surgery  Neurology: Gross sensation intact via light touch bilateral, Protective sensation intact with Phoebe Perch Monofilament to all pedal sites, however there is areas of hypersensitivity along the medial ankle and anterior ankle and lower leg and shin subjective sharp shooting pains to these areas worse at the left medial ankle.  Musculoskeletal: Significant limitation of ankle joint range of motion on the left.  There is guarding due to pain on the left  Xrays  Left ankle   Impression: There is widening of the medial gutter, there is significant hardware tibia and fibula due to previous ORIF  Assessment and Plan: Problem List Items Addressed This Visit       Musculoskeletal and Integument   Closed left pilon fracture, initial encounter   Closed left fibular fracture   Other Visit Diagnoses     Acute left ankle pain    -  Primary   Relevant Orders   DG Ankle Complete Left   Pain in left leg       Left ankle pain, unspecified chronicity       Acquired equinus deformity of left foot            -Complete examination performed -Xrays reviewed -Discussed treatment options for continued left foot and ankle pain after extensive surgery and  trauma -Since patient is very adamant about not wanting another surgery advised patient that it may be worth trying physical therapy prescription was written [facility] advised patient if she fails therapy may need to follow-up with her original surgeon for surgery in the future however I think it may be worth a try to try physical therapy first -Advised patient if physical therapy helps may benefit long-term from an AFO -Prescribe Lidoderm patches for patient to use for pain -Educated patient on concerns of possible chronic regional pain syndrome however at this time I do think it may be too early to decide if she has this condition states her original injury and surgery was very extensive -Patient to return to office after physical therapy or sooner if condition worsens.  Asencion Islam, DPM

## 2022-09-24 DIAGNOSIS — D518 Other vitamin B12 deficiency anemias: Secondary | ICD-10-CM | POA: Diagnosis not present

## 2022-09-24 DIAGNOSIS — I10 Essential (primary) hypertension: Secondary | ICD-10-CM | POA: Diagnosis not present

## 2022-09-24 DIAGNOSIS — E782 Mixed hyperlipidemia: Secondary | ICD-10-CM | POA: Diagnosis not present

## 2022-09-24 DIAGNOSIS — M150 Primary generalized (osteo)arthritis: Secondary | ICD-10-CM | POA: Diagnosis not present

## 2022-09-24 DIAGNOSIS — E119 Type 2 diabetes mellitus without complications: Secondary | ICD-10-CM | POA: Diagnosis not present

## 2022-09-24 DIAGNOSIS — E1165 Type 2 diabetes mellitus with hyperglycemia: Secondary | ICD-10-CM | POA: Diagnosis not present

## 2022-09-24 DIAGNOSIS — E559 Vitamin D deficiency, unspecified: Secondary | ICD-10-CM | POA: Diagnosis not present

## 2022-09-24 DIAGNOSIS — E038 Other specified hypothyroidism: Secondary | ICD-10-CM | POA: Diagnosis not present

## 2022-10-18 DIAGNOSIS — F250 Schizoaffective disorder, bipolar type: Secondary | ICD-10-CM | POA: Diagnosis not present

## 2022-10-25 DIAGNOSIS — F250 Schizoaffective disorder, bipolar type: Secondary | ICD-10-CM | POA: Diagnosis not present

## 2022-10-26 DIAGNOSIS — E559 Vitamin D deficiency, unspecified: Secondary | ICD-10-CM | POA: Diagnosis not present

## 2022-10-26 DIAGNOSIS — E782 Mixed hyperlipidemia: Secondary | ICD-10-CM | POA: Diagnosis not present

## 2022-10-26 DIAGNOSIS — E038 Other specified hypothyroidism: Secondary | ICD-10-CM | POA: Diagnosis not present

## 2022-10-26 DIAGNOSIS — D518 Other vitamin B12 deficiency anemias: Secondary | ICD-10-CM | POA: Diagnosis not present

## 2022-10-26 DIAGNOSIS — E1165 Type 2 diabetes mellitus with hyperglycemia: Secondary | ICD-10-CM | POA: Diagnosis not present

## 2022-10-26 DIAGNOSIS — I10 Essential (primary) hypertension: Secondary | ICD-10-CM | POA: Diagnosis not present

## 2022-10-26 DIAGNOSIS — M150 Primary generalized (osteo)arthritis: Secondary | ICD-10-CM | POA: Diagnosis not present

## 2022-10-26 DIAGNOSIS — E119 Type 2 diabetes mellitus without complications: Secondary | ICD-10-CM | POA: Diagnosis not present

## 2022-11-01 DIAGNOSIS — F250 Schizoaffective disorder, bipolar type: Secondary | ICD-10-CM | POA: Diagnosis not present

## 2022-11-08 DIAGNOSIS — F250 Schizoaffective disorder, bipolar type: Secondary | ICD-10-CM | POA: Diagnosis not present

## 2022-11-09 DIAGNOSIS — E1165 Type 2 diabetes mellitus with hyperglycemia: Secondary | ICD-10-CM | POA: Diagnosis not present

## 2022-11-09 DIAGNOSIS — E782 Mixed hyperlipidemia: Secondary | ICD-10-CM | POA: Diagnosis not present

## 2022-11-09 DIAGNOSIS — M150 Primary generalized (osteo)arthritis: Secondary | ICD-10-CM | POA: Diagnosis not present

## 2022-11-09 DIAGNOSIS — E559 Vitamin D deficiency, unspecified: Secondary | ICD-10-CM | POA: Diagnosis not present

## 2022-11-09 DIAGNOSIS — I10 Essential (primary) hypertension: Secondary | ICD-10-CM | POA: Diagnosis not present

## 2022-11-09 DIAGNOSIS — E038 Other specified hypothyroidism: Secondary | ICD-10-CM | POA: Diagnosis not present

## 2022-11-09 DIAGNOSIS — D518 Other vitamin B12 deficiency anemias: Secondary | ICD-10-CM | POA: Diagnosis not present

## 2022-11-09 DIAGNOSIS — E119 Type 2 diabetes mellitus without complications: Secondary | ICD-10-CM | POA: Diagnosis not present

## 2022-11-23 DIAGNOSIS — F250 Schizoaffective disorder, bipolar type: Secondary | ICD-10-CM | POA: Diagnosis not present

## 2022-12-07 DIAGNOSIS — F250 Schizoaffective disorder, bipolar type: Secondary | ICD-10-CM | POA: Diagnosis not present

## 2022-12-19 DIAGNOSIS — F250 Schizoaffective disorder, bipolar type: Secondary | ICD-10-CM | POA: Diagnosis not present

## 2022-12-21 DIAGNOSIS — E559 Vitamin D deficiency, unspecified: Secondary | ICD-10-CM | POA: Diagnosis not present

## 2022-12-21 DIAGNOSIS — M150 Primary generalized (osteo)arthritis: Secondary | ICD-10-CM | POA: Diagnosis not present

## 2022-12-21 DIAGNOSIS — F319 Bipolar disorder, unspecified: Secondary | ICD-10-CM | POA: Diagnosis not present

## 2022-12-21 DIAGNOSIS — M797 Fibromyalgia: Secondary | ICD-10-CM | POA: Diagnosis not present

## 2022-12-21 DIAGNOSIS — D518 Other vitamin B12 deficiency anemias: Secondary | ICD-10-CM | POA: Diagnosis not present

## 2022-12-21 DIAGNOSIS — E782 Mixed hyperlipidemia: Secondary | ICD-10-CM | POA: Diagnosis not present

## 2022-12-21 DIAGNOSIS — E038 Other specified hypothyroidism: Secondary | ICD-10-CM | POA: Diagnosis not present

## 2022-12-21 DIAGNOSIS — F250 Schizoaffective disorder, bipolar type: Secondary | ICD-10-CM | POA: Diagnosis not present

## 2022-12-21 DIAGNOSIS — E1165 Type 2 diabetes mellitus with hyperglycemia: Secondary | ICD-10-CM | POA: Diagnosis not present

## 2022-12-27 DIAGNOSIS — F250 Schizoaffective disorder, bipolar type: Secondary | ICD-10-CM | POA: Diagnosis not present

## 2023-01-03 DIAGNOSIS — E1165 Type 2 diabetes mellitus with hyperglycemia: Secondary | ICD-10-CM | POA: Diagnosis not present

## 2023-01-03 DIAGNOSIS — M797 Fibromyalgia: Secondary | ICD-10-CM | POA: Diagnosis not present

## 2023-01-03 DIAGNOSIS — F319 Bipolar disorder, unspecified: Secondary | ICD-10-CM | POA: Diagnosis not present

## 2023-01-03 DIAGNOSIS — F250 Schizoaffective disorder, bipolar type: Secondary | ICD-10-CM | POA: Diagnosis not present

## 2023-01-03 DIAGNOSIS — D518 Other vitamin B12 deficiency anemias: Secondary | ICD-10-CM | POA: Diagnosis not present

## 2023-01-03 DIAGNOSIS — E038 Other specified hypothyroidism: Secondary | ICD-10-CM | POA: Diagnosis not present

## 2023-01-03 DIAGNOSIS — E559 Vitamin D deficiency, unspecified: Secondary | ICD-10-CM | POA: Diagnosis not present

## 2023-01-03 DIAGNOSIS — E782 Mixed hyperlipidemia: Secondary | ICD-10-CM | POA: Diagnosis not present

## 2023-01-03 DIAGNOSIS — M150 Primary generalized (osteo)arthritis: Secondary | ICD-10-CM | POA: Diagnosis not present

## 2023-01-06 DIAGNOSIS — F250 Schizoaffective disorder, bipolar type: Secondary | ICD-10-CM | POA: Diagnosis not present

## 2023-01-18 DIAGNOSIS — F250 Schizoaffective disorder, bipolar type: Secondary | ICD-10-CM | POA: Diagnosis not present

## 2023-02-07 DIAGNOSIS — F250 Schizoaffective disorder, bipolar type: Secondary | ICD-10-CM | POA: Diagnosis not present

## 2023-02-14 DIAGNOSIS — F250 Schizoaffective disorder, bipolar type: Secondary | ICD-10-CM | POA: Diagnosis not present

## 2023-02-24 DIAGNOSIS — E559 Vitamin D deficiency, unspecified: Secondary | ICD-10-CM | POA: Diagnosis not present

## 2023-02-24 DIAGNOSIS — E1165 Type 2 diabetes mellitus with hyperglycemia: Secondary | ICD-10-CM | POA: Diagnosis not present

## 2023-02-24 DIAGNOSIS — E038 Other specified hypothyroidism: Secondary | ICD-10-CM | POA: Diagnosis not present

## 2023-02-24 DIAGNOSIS — M797 Fibromyalgia: Secondary | ICD-10-CM | POA: Diagnosis not present

## 2023-02-24 DIAGNOSIS — M150 Primary generalized (osteo)arthritis: Secondary | ICD-10-CM | POA: Diagnosis not present

## 2023-02-24 DIAGNOSIS — F319 Bipolar disorder, unspecified: Secondary | ICD-10-CM | POA: Diagnosis not present

## 2023-02-24 DIAGNOSIS — F250 Schizoaffective disorder, bipolar type: Secondary | ICD-10-CM | POA: Diagnosis not present

## 2023-02-24 DIAGNOSIS — E782 Mixed hyperlipidemia: Secondary | ICD-10-CM | POA: Diagnosis not present

## 2023-02-24 DIAGNOSIS — D518 Other vitamin B12 deficiency anemias: Secondary | ICD-10-CM | POA: Diagnosis not present

## 2023-03-09 DIAGNOSIS — F250 Schizoaffective disorder, bipolar type: Secondary | ICD-10-CM | POA: Diagnosis not present

## 2023-03-21 DIAGNOSIS — E559 Vitamin D deficiency, unspecified: Secondary | ICD-10-CM | POA: Diagnosis not present

## 2023-03-21 DIAGNOSIS — M797 Fibromyalgia: Secondary | ICD-10-CM | POA: Diagnosis not present

## 2023-03-21 DIAGNOSIS — F319 Bipolar disorder, unspecified: Secondary | ICD-10-CM | POA: Diagnosis not present

## 2023-03-21 DIAGNOSIS — E1165 Type 2 diabetes mellitus with hyperglycemia: Secondary | ICD-10-CM | POA: Diagnosis not present

## 2023-03-21 DIAGNOSIS — E782 Mixed hyperlipidemia: Secondary | ICD-10-CM | POA: Diagnosis not present

## 2023-03-21 DIAGNOSIS — E038 Other specified hypothyroidism: Secondary | ICD-10-CM | POA: Diagnosis not present

## 2023-03-21 DIAGNOSIS — M150 Primary generalized (osteo)arthritis: Secondary | ICD-10-CM | POA: Diagnosis not present

## 2023-03-21 DIAGNOSIS — D518 Other vitamin B12 deficiency anemias: Secondary | ICD-10-CM | POA: Diagnosis not present

## 2023-03-21 DIAGNOSIS — F250 Schizoaffective disorder, bipolar type: Secondary | ICD-10-CM | POA: Diagnosis not present
